# Patient Record
Sex: Female | Born: 1940 | Race: White | Hispanic: No | Smoking: Former smoker
Health system: Southern US, Community
[De-identification: ages and names within clinical notes are randomized; demographics above are authoritative.]

## PROBLEM LIST (undated history)

## (undated) DIAGNOSIS — J439 Emphysema, unspecified: Secondary | ICD-10-CM

## (undated) DIAGNOSIS — I519 Heart disease, unspecified: Secondary | ICD-10-CM

## (undated) DIAGNOSIS — M1990 Unspecified osteoarthritis, unspecified site: Secondary | ICD-10-CM

## (undated) DIAGNOSIS — I10 Essential (primary) hypertension: Secondary | ICD-10-CM

## (undated) DIAGNOSIS — E079 Disorder of thyroid, unspecified: Secondary | ICD-10-CM

## (undated) DIAGNOSIS — J449 Chronic obstructive pulmonary disease, unspecified: Secondary | ICD-10-CM

## (undated) HISTORY — PX: BREAST BIOPSY: SHX20

## (undated) HISTORY — PX: ABDOMINAL AORTIC ANEURYSM REPAIR: SUR1152

## (undated) HISTORY — PX: LEG SURGERY: SHX1003

## (undated) HISTORY — DX: Heart disease, unspecified: I51.9

## (undated) HISTORY — DX: Unspecified osteoarthritis, unspecified site: M19.90

## (undated) HISTORY — PX: CHOLECYSTECTOMY: SHX55

## (undated) HISTORY — DX: Disorder of thyroid, unspecified: E07.9

## (undated) HISTORY — DX: Emphysema, unspecified: J43.9

---

## 2019-09-16 HISTORY — PX: PACEMAKER IMPLANT: EP1218

## 2020-04-09 DIAGNOSIS — Z9889 Other specified postprocedural states: Secondary | ICD-10-CM | POA: Insufficient documentation

## 2020-04-09 DIAGNOSIS — Z950 Presence of cardiac pacemaker: Secondary | ICD-10-CM | POA: Insufficient documentation

## 2020-04-09 DIAGNOSIS — I739 Peripheral vascular disease, unspecified: Secondary | ICD-10-CM | POA: Insufficient documentation

## 2020-04-09 DIAGNOSIS — J449 Chronic obstructive pulmonary disease, unspecified: Secondary | ICD-10-CM | POA: Insufficient documentation

## 2020-04-18 ENCOUNTER — Other Ambulatory Visit: Payer: Self-pay

## 2020-04-18 ENCOUNTER — Emergency Department
Admission: EM | Admit: 2020-04-18 | Discharge: 2020-04-18 | Disposition: A | Discharge status: Home / Self Care | Payer: Medicare Other | Attending: Emergency Medicine | Admitting: Emergency Medicine

## 2020-04-18 DIAGNOSIS — J449 Chronic obstructive pulmonary disease, unspecified: Secondary | ICD-10-CM | POA: Insufficient documentation

## 2020-04-18 DIAGNOSIS — R040 Epistaxis: Secondary | ICD-10-CM | POA: Insufficient documentation

## 2020-04-18 HISTORY — DX: Chronic obstructive pulmonary disease, unspecified: J44.9

## 2020-04-18 LAB — CBC
HCT: 32.9 % — ABNORMAL LOW (ref 36.0–46.0)
HCT: 31.8 % — ABNORMAL LOW (ref 36.0–46.0)
Hemoglobin: 10.6 g/dL — ABNORMAL LOW (ref 12.0–15.0)
Hemoglobin: 10.2 g/dL — ABNORMAL LOW (ref 12.0–15.0)
MCH: 31.4 pg (ref 26.0–34.0)
MCH: 31.2 pg (ref 26.0–34.0)
MCHC: 32.2 g/dL (ref 30.0–36.0)
MCHC: 32.1 g/dL (ref 30.0–36.0)
MCV: 97.3 fL (ref 80.0–100.0)
MCV: 97.2 fL (ref 80.0–100.0)
Platelets: 227 10*3/uL (ref 150–400)
Platelets: 210 10*3/uL (ref 150–400)
RBC: 3.38 MIL/uL — ABNORMAL LOW (ref 3.87–5.11)
RBC: 3.27 MIL/uL — ABNORMAL LOW (ref 3.87–5.11)
RDW: 13.2 % (ref 11.5–15.5)
RDW: 13 % (ref 11.5–15.5)
WBC: 6 10*3/uL (ref 4.0–10.5)
WBC: 6.5 10*3/uL (ref 4.0–10.5)
nRBC: 0 % (ref 0.0–0.2)
nRBC: 0 % (ref 0.0–0.2)

## 2020-04-18 LAB — PROTIME-INR
INR: 1.3 — ABNORMAL HIGH (ref 0.8–1.2)
Prothrombin Time: 15.9 seconds — ABNORMAL HIGH (ref 11.4–15.2)

## 2020-04-18 MED ORDER — SILVER NITRATE-POT NITRATE 75-25 % EX MISC
1.0000 | CUTANEOUS | Status: AC
  Administered 2020-04-18: 1 via TOPICAL
  Filled 2020-04-18: qty 10

## 2020-04-18 NOTE — ED Triage Notes (Addendum)
Arrives [facility] for ED evaluation of epistaxis x 2 hours.  2 sprays afrin given by EMS.  Patient takes Eloquis.   Arrives from home.

## 2020-04-18 NOTE — ED Notes (Signed)
Rounded on patient.  Daughter concerned that patient continues to have nose bleed.  Patient assessed.  Small amount of epistaxis noted.  Nose clamp in place.  Patient is AAOx3.  Skin  Pale, warm and dry. NAD

## 2020-04-18 NOTE — ED Notes (Signed)
Pt placed on 2L [place]. [place] placed on face to bottom lips due to nose currenly being clamped due to bleed

## 2020-04-18 NOTE — ED Provider Notes (Signed)
[facility] Emergency Department Provider Note   ____________________________________________   First MD Initiated Contact with Patient 04/18/20 1135     (approximate)  I have reviewed the triage vital signs and the nursing notes.   HISTORY  Chief Complaint Epistaxis    HPI Danielle Sandoval is a 79 y.o. female here for evaluation of nosebleed  This morning at 4:18 AM patient began developing bleeding from her right nose.  They attempted to hold pressure over it briefly but this did not assist.  She does take blood thinner for atrial fibrillation.  She is continued to have intermittent bleeding from the right nose, had a slight amount last night.  No injury but she does report that she was picking at it when it started last night.  Otherwise feeling well.  No history of falls or injuries.  No fevers or chills.  No sinus pain.    Bleeding seems to have stopped after having her nose clamped in the ED  Past Medical History:  Diagnosis Date  . COPD (chronic obstructive pulmonary disease) (HCC)     There are no problems to display for this patient.     Prior to Admission medications   Not on File    Allergies Patient has no allergy information on record.  No family history on file.  Social History Social History   Tobacco Use  . Smoking status: Not on file  Substance Use Topics  . Alcohol use: Not on file  . Drug use: Not on file    Review of Systems Constitutional: No fever/chills or recent illness Eyes: No visual changes. ENT: See HPI Cardiovascular: Denies chest pain. Respiratory: Denies shortness of breath.  Uses 2 L of oxygen at baseline Gastrointestinal: No abdominal pain.   Skin: Negative for rash. Neurological: Negative for headaches or weakness.    ____________________________________________   PHYSICAL EXAM:  VITAL SIGNS: ED Triage Vitals  Enc Vitals Group     BP 04/18/20 0719 (!) 80/70     Pulse Rate 04/18/20 0719  76     Resp 04/18/20 0719 20     Temp 04/18/20 0719 97.8 F (36.6 C)     Temp Source 04/18/20 0719 Axillary     SpO2 04/18/20 0719 91 %     Weight 04/18/20 0718 98 lb (44.5 kg)     Height 04/18/20 0718 5\' 1"  (1.549 m)     Head Circumference --      Peak Flow --      Pain Score 04/18/20 0721 0     Pain Loc --      Pain Edu? --      Excl. in GC? --     Constitutional: Alert and oriented. Well appearing and in no acute distress.  Daughter also at bedside.  Patient very pleasant in no distress. Eyes: Conjunctivae are normal. Head: Atraumatic. Nose: No congestion/rhinnorhea.  She does have evidence of small amount of clot adherent to the region of Hesselbach's plexus in the right nare.  No evidence of bleeding over the left side.  Turbinates appear normal except for a small amount of adherent clot over the right nare anterior along the septum.  No septal hematoma. Mouth/Throat: Mucous membranes are moist.  No active bleeding in the posterior oropharynx. Neck: No stridor.  Cardiovascular: Normal rate, regular rhythm. Grossly normal heart sounds.  Good peripheral circulation. Respiratory: Normal respiratory effort.  No retractions. Lungs CTAB. Gastrointestinal: Soft and nontender. No distention. Musculoskeletal: No lower extremity tenderness  nor edema. Neurologic:  Normal speech and language. No gross focal neurologic deficits are appreciated.  Skin:  Skin is warm, dry and intact. No rash noted. Psychiatric: Mood and affect are normal. Speech and behavior are normal.  ____________________________________________   LABS (all labs ordered are listed, but only abnormal results are displayed)  Labs Reviewed  CBC - Abnormal; Notable for the following components:      Result Value   RBC 3.38 (*)    Hemoglobin 10.6 (*)    HCT 32.9 (*)    All other components within normal limits  PROTIME-INR - Abnormal; Notable for the following components:   Prothrombin Time 15.9 (*)    INR 1.3 (*)     All other components within normal limits  CBC - Abnormal; Notable for the following components:   RBC 3.27 (*)    Hemoglobin 10.2 (*)    HCT 31.8 (*)    All other components within normal limits   ____________________________________________  EKG   ____________________________________________  RADIOLOGY   ____________________________________________   PROCEDURES  Procedure(s) performed: Epistaxis management  .Epistaxis Management  Date/Time: 04/18/2020 3:23 PM Performed by: Sharyn Creamer, MD Authorized by: Sharyn Creamer, MD   Consent:    Consent obtained:  Verbal   Consent given by:  Patient   Risks discussed:  Bleeding, infection and pain   Alternatives discussed:  Referral Anesthesia (see MAR for exact dosages):    Anesthesia method:  None Procedure details:    Treatment site:  R septum   Treatment method:  Silver nitrate   Treatment complexity:  Limited   Treatment episode: initial   Post-procedure details:    Assessment:  Bleeding stopped   Patient tolerance of procedure:  Tolerated well, no immediate complications    Critical Care performed: No  ____________________________________________   INITIAL IMPRESSION / ASSESSMENT AND PLAN / ED COURSE  Pertinent labs & imaging results that were available during my care of the patient were reviewed by me and considered in my medical decision making (see chart for details).   After initial clamping, the patient did have some very slight amount of recurrent epistaxis from the right anterior nare, discussed risk benefits and we are able to use silver nitrate stick to cauterize and stop bleeding.  Patient tolerated procedure very well.  No complications.  All bleeding ceased thereafter.  Patient is on anticoagulation, she will follow up closely and has made appointment actually with ENT now on Monday.  Careful return precautions and treatment recommendations advised.    Return precautions and treatment recommendations  and follow-up discussed with the patient who is agreeable with the plan.       ____________________________________________   FINAL CLINICAL IMPRESSION(S) / ED DIAGNOSES  Final diagnoses:  Anterior epistaxis        Note:  This document was prepared using Dragon voice recognition software and may include unintentional dictation errors       Sharyn Creamer, MD 04/18/20 1525

## 2020-05-07 ENCOUNTER — Encounter (INDEPENDENT_AMBULATORY_CARE_PROVIDER_SITE_OTHER): Payer: Self-pay | Admitting: Vascular Surgery

## 2020-05-07 ENCOUNTER — Other Ambulatory Visit: Payer: Self-pay

## 2020-05-07 ENCOUNTER — Ambulatory Visit (INDEPENDENT_AMBULATORY_CARE_PROVIDER_SITE_OTHER): Payer: Medicare Other | Admitting: Vascular Surgery

## 2020-05-07 VITALS — BP 110/67 | HR 77 | Resp 14 | Ht 61.0 in | Wt 100.0 lb

## 2020-05-07 DIAGNOSIS — I714 Abdominal aortic aneurysm, without rupture, unspecified: Secondary | ICD-10-CM

## 2020-05-07 DIAGNOSIS — J449 Chronic obstructive pulmonary disease, unspecified: Secondary | ICD-10-CM | POA: Diagnosis not present

## 2020-05-07 DIAGNOSIS — I739 Peripheral vascular disease, unspecified: Secondary | ICD-10-CM

## 2020-05-07 DIAGNOSIS — R0989 Other specified symptoms and signs involving the circulatory and respiratory systems: Secondary | ICD-10-CM | POA: Diagnosis not present

## 2020-05-08 ENCOUNTER — Encounter (INDEPENDENT_AMBULATORY_CARE_PROVIDER_SITE_OTHER): Payer: Self-pay | Admitting: Vascular Surgery

## 2020-05-08 DIAGNOSIS — I714 Abdominal aortic aneurysm, without rupture, unspecified: Secondary | ICD-10-CM | POA: Insufficient documentation

## 2020-05-08 DIAGNOSIS — I6529 Occlusion and stenosis of unspecified carotid artery: Secondary | ICD-10-CM | POA: Insufficient documentation

## 2020-05-08 NOTE — Progress Notes (Signed)
MRN : [id number]  Danielle Sandoval is a 79 y.o. (Apr 15, 1941) female who presents with chief complaint of  Chief Complaint  Patient presents with  . New Patient (Initial Visit)    ref [place] PVD  .  History of Present Illness:   Patient is seen for evaluation of abdominal aortic aneurysm.  She also states she has an aneurysm in her leg but she is uncertain as to the location or which side.  Patient denies abdominal pain or unusual back pain, no other abdominal complaints.  No history of an acute onset of painful blue discoloration of the toes.     No family history of AAA.   Patient denies amaurosis fugax or TIA symptoms. There is no history of claudication or rest pain symptoms of the lower extremities.  The patient denies angina or shortness of breath.    Current Meds  Medication Sig  . acetaminophen (TYLENOL) 650 MG suppository SMARTSIG:1 SUPPOS Rectally Every 4 Hours PRN  . apixaban (ELIQUIS) 5 MG TABS tablet   . BREO ELLIPTA 200-25 MCG/INH AEPB SMARTSIG:1 Inhalation Via Inhaler Daily  . clopidogrel (PLAVIX) 75 MG tablet Take 75 mg by mouth daily.  . COMBIVENT RESPIMAT 20-100 MCG/ACT AERS respimat SMARTSIG:2 Inhalation Via Inhaler 4 Times Daily  . escitalopram (LEXAPRO) 5 MG tablet Take 5 mg by mouth daily.  . ferrous sulfate 325 (65 FE) MG EC tablet Take 1 tablet by mouth daily.  . fluticasone (FLONASE) 50 MCG/ACT nasal spray Place into the nose.  . furosemide (LASIX) 20 MG tablet Take 20 mg by mouth daily.  Marland Kitchen gabapentin (NEURONTIN) 100 MG capsule Take 100 mg by mouth 2 (two) times daily.  Marland Kitchen levothyroxine (SYNTHROID) 25 MCG tablet Take 25 mcg by mouth daily.  Marland Kitchen LORazepam (ATIVAN) 0.5 MG tablet Take 0.5 mg by mouth every 4 (four) hours as needed.  . metoprolol succinate (TOPROL-XL) 25 MG 24 hr tablet   . pantoprazole (PROTONIX) 40 MG tablet Take 40 mg by mouth daily.    Past Medical History:  Diagnosis Date  . Arthritis   . COPD (chronic obstructive pulmonary disease) (HCC)     . Emphysema of lung (HCC)   . Heart disease   . Thyroid disease      Social History Social History   Tobacco Use  . Smoking status: Former Games developer  . Smokeless tobacco: Never Used  Substance Use Topics  . Alcohol use: Never  . Drug use: Never    Family History Family History  Problem Relation Age of Onset  . Dementia Mother   . Prostate cancer Father   . Varicose Veins Daughter   . Diabetes Daughter   No family history of bleeding/clotting disorders, porphyria or autoimmune disease   Allergies  Allergen Reactions  . Atorvastatin Itching  . Azithromycin Swelling    Swelling of the throat   . Clotrimazole Hives  . Fluvastatin Other (See Comments)  . Lisinopril     Other reaction(s): Unknown  . Lovastatin Other (See Comments)    Leg aches   . Potassium Bromide     Other reaction(s): Unknown  . Risedronate Sodium Itching  . Tiotropium     Other reaction(s): Dizziness     REVIEW OF SYSTEMS (Negative unless checked)  Constitutional: [] Weight loss  [] Fever  [] Chills Cardiac: [] Chest pain   [] Chest pressure   [] Palpitations   [] Shortness of breath when laying flat   [] Shortness of breath with exertion. Vascular:  [] Pain in legs with walking   []   Pain in legs at rest  [] History of DVT   [] Phlebitis   [] Swelling in legs   [] Varicose veins   [] Non-healing ulcers Pulmonary:   [] Uses home oxygen   [] Productive cough   [] Hemoptysis   [] Wheeze  [] COPD   [] Asthma Neurologic:  [] Dizziness   [] Seizures   [] History of stroke   [] History of TIA  [] Aphasia   [] Vissual changes   [] Weakness or numbness in arm   [] Weakness or numbness in leg Musculoskeletal:   [] Joint swelling   [x] Joint pain   [] Low back pain Hematologic:  [] Easy bruising  [] Easy bleeding   [] Hypercoagulable state   [] Anemic Gastrointestinal:  [] Diarrhea   [] Vomiting  [] Gastroesophageal reflux/heartburn   [] Difficulty swallowing. Genitourinary:  [] Chronic kidney disease   [] Difficult urination  [] Frequent urination    [] Blood in urine Skin:  [] Rashes   [] Ulcers  Psychological:  [] History of anxiety   []  History of major depression.  Physical Examination  Vitals:   05/07/20 1313  BP: 110/67  Pulse: 77  Resp: 14  Weight: 100 lb (45.4 kg)  Height: 5\' 1"  (1.549 m)   Body mass index is 18.89 kg/m. Gen: WD/WN, NAD Head: [place]/AT, No temporalis wasting.  Ear/Nose/Throat: Hearing grossly intact, nares w/o erythema or drainage, poor dentition Eyes: PER, EOMI, sclera nonicteric.  Neck: Supple, no masses.  No bruit or JVD.  Pulmonary:  Good air movement, clear to auscultation bilaterally, no use of accessory muscles.  Cardiac: RRR, normal S1, S2. Vascular: Popliteal pulses are mildly broadened; bilateral carotid bruits Vessel Right Left  Radial Palpable Palpable  Popliteal Palpable Palpable  PT Not Palpable Not Palpable  DP Not Palpable Not Palpable  Gastrointestinal: soft, non-distended. No guarding/no peritoneal signs.  Musculoskeletal: M/S 5/5 throughout.  No deformity or atrophy.  Neurologic: CN 2-12 intact. Pain and light touch intact in extremities.  Symmetrical.  Speech is fluent. Motor exam as listed above. Psychiatric: Judgment intact, Mood & affect appropriate for pt's clinical situation. Dermatologic: No rashes or ulcers noted.  No changes consistent with cellulitis.  CBC Lab Results  Component Value Date   WBC 6.5 04/18/2020   HGB 10.2 (L) 04/18/2020   HCT 31.8 (L) 04/18/2020   MCV 97.2 04/18/2020   PLT 210 04/18/2020    BMET No results found for: NA, K, CL, CO2, GLUCOSE, BUN, CREATININE, CALCIUM, GFRNONAA, GFRAA CrCl cannot be calculated (No successful lab value found.).  COAG Lab Results  Component Value Date   INR 1.3 (H) 04/18/2020    Radiology No results found.    Assessment/Plan 1. AAA (abdominal aortic aneurysm) without rupture (HCC) No surgery or intervention at this time. The patient has an asymptomatic abdominal aortic aneurysm by history and is due for her  ultrasound.  I have discussed the natural history of abdominal aortic aneurysm and the small risk of rupture for aneurysm less than 5 cm in size.  However, as these small aneurysms tend to enlarge over time, continued surveillance with ultrasound or CT scan is mandatory.  I have also discussed optimizing medical management with hypertension and lipid control and the importance of abstinence from tobacco.  The patient is also encouraged to exercise a minimum of 30 minutes 4 times a week.  Should the patient develop new onset abdominal or back pain or signs of peripheral embolization they are instructed to seek medical attention immediately and to alert the physician providing care that they have an aneurysm.  The patient voices their understanding. I have scheduled the patient to return  in 1 month with an aortic duplex. - VAS US AORTA/IVC/ILIACS; Future  2. Peripheral vascular disease (HCC) Recommend:  Patient should undergo arterial duplex of the lower extremity.    The risks and benefits as well as the alternatives were discussed in detail with the patient.  All questions were answered.  Patient agrees to proceed and understands this could be a prelude to angiography and intervention.  The patient will follow up with me in the office to review the studies.  - VAS [place] LOWER EXTREMITY ARTERIAL DUPLEX; Future  3. Chronic obstructive pulmonary disease, unspecified COPD type (HCC) Continue pulmonary medications and aerosols as already ordered, these medications have been reviewed and there are no changes at this time.    4. Bilateral carotid bruits Recommend:  Given the patient's asymptomatic carotid bruits no further invasive testing or surgery at this time.  Duplex ultrasound will be ordered.  Continue antiplatelet therapy as prescribed Continue management of CAD, HTN and Hyperlipidemia Healthy heart diet,  encouraged exercise at least 4 times per week Follow up in 1 month with duplex  ultrasound and physical exam  - VAS US CAROTID; Future   Levora Dredge, MD  05/08/2020 12:28 PM

## 2020-05-31 ENCOUNTER — Ambulatory Visit (INDEPENDENT_AMBULATORY_CARE_PROVIDER_SITE_OTHER): Payer: Medicare Other

## 2020-05-31 ENCOUNTER — Encounter (INDEPENDENT_AMBULATORY_CARE_PROVIDER_SITE_OTHER): Payer: Self-pay | Admitting: Vascular Surgery

## 2020-05-31 ENCOUNTER — Other Ambulatory Visit: Payer: Self-pay

## 2020-05-31 ENCOUNTER — Ambulatory Visit (INDEPENDENT_AMBULATORY_CARE_PROVIDER_SITE_OTHER): Payer: Medicare Other | Admitting: Vascular Surgery

## 2020-05-31 VITALS — BP 95/59 | HR 94 | Resp 21 | Ht 61.0 in | Wt 100.0 lb

## 2020-05-31 DIAGNOSIS — I739 Peripheral vascular disease, unspecified: Secondary | ICD-10-CM

## 2020-05-31 DIAGNOSIS — Z9889 Other specified postprocedural states: Secondary | ICD-10-CM

## 2020-05-31 DIAGNOSIS — I714 Abdominal aortic aneurysm, without rupture, unspecified: Secondary | ICD-10-CM

## 2020-05-31 DIAGNOSIS — R0989 Other specified symptoms and signs involving the circulatory and respiratory systems: Secondary | ICD-10-CM

## 2020-05-31 DIAGNOSIS — J449 Chronic obstructive pulmonary disease, unspecified: Secondary | ICD-10-CM | POA: Diagnosis not present

## 2020-05-31 DIAGNOSIS — I6523 Occlusion and stenosis of bilateral carotid arteries: Secondary | ICD-10-CM | POA: Diagnosis not present

## 2020-06-01 ENCOUNTER — Encounter (INDEPENDENT_AMBULATORY_CARE_PROVIDER_SITE_OTHER): Payer: Self-pay | Admitting: Vascular Surgery

## 2020-06-01 NOTE — Progress Notes (Signed)
MRN : [id number]  Danielle Sandoval is a 79 y.o. (Jun 23, 1941) female who presents with chief complaint of  Chief Complaint  Patient presents with  . Follow-up  .  History of Present Illness:   The patient returns to the office for surveillance of an abdominal aortic aneurysm status post stent graft placement on 08/2015.   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 The patient also has peripheral vascular disease status post abdominal aortic stent 08/2015 and right leg stents 06/2016  Patient denies abdominal pain or back pain, no other abdominal complaints. No groin related complaints. No symptoms consistent with distal embolization No changes in claudication distance.   There have been no interval changes in his overall healthcare since his last visit.   Patient denies amaurosis fugax or TIA symptoms. There is no history of claudication or rest pain symptoms of the lower extremities. The patient denies angina or shortness of breath.   Duplex US of the aorta and iliac arteries shows a 4.05 cm AAA sac with no endoleak.  Duplex ultrasound of the lower extremities bilaterally shows patent arteries with triphasic signals no hemodynamicly significant stenosis is identified.    Duplex ultrasound the carotid arteries is consistent with less than 40% stenosis bilateral internal carotid arteries  Current Meds  Medication Sig  . apixaban (ELIQUIS) 5 MG TABS tablet   . Ascorbic Acid (VITAMIN C) 1000 MG tablet SMARTSIG:By Mouth  . BREO ELLIPTA 200-25 MCG/INH AEPB SMARTSIG:1 Inhalation Via Inhaler Daily  . clopidogrel (PLAVIX) 75 MG tablet Take 75 mg by mouth daily.  . COMBIVENT RESPIMAT 20-100  MCG/ACT AERS respimat SMARTSIG:2 Inhalation Via Inhaler 4 Times Daily  . DULCOLAX 5 MG EC tablet Take 10 mg by mouth daily.  Marland Kitchen escitalopram (LEXAPRO) 5 MG tablet Take 5 mg by mouth daily.  . ferrous sulfate 325 (65 FE) MG EC tablet Take 1 tablet by mouth daily.  . fluticasone (FLONASE) 50 MCG/ACT nasal spray Place into the nose.  . furosemide (LASIX) 20 MG tablet Take 20 mg by mouth daily.  Marland Kitchen gabapentin (NEURONTIN) 100 MG capsule Take 100 mg by mouth 2 (two) times daily.  Marland Kitchen levothyroxine (SYNTHROID) 25 MCG tablet Take 25 mcg by mouth daily.  Marland Kitchen MELATONIN MAXIMUM STRENGTH 5 MG TABS SMARTSIG:1 Tablet(s) By Mouth PRN PRN  . metoprolol succinate (TOPROL-XL) 25 MG 24 hr tablet   . MUCINEX MAXIMUM STRENGTH 1200 MG TB12 Take 1 tablet by mouth daily.  . pantoprazole (PROTONIX) 40 MG tablet Take 40 mg by mouth daily.  Marland Kitchen VITAMIN D-1000 MAX ST 25 MCG (1000 UT) tablet Take 1,000 Units by mouth daily.    Past Medical History:  Diagnosis Date  . Arthritis   . COPD (chronic obstructive pulmonary disease) (HCC)   . Emphysema of lung (HCC)   . Heart disease   . Thyroid disease     Past Surgical History:  Procedure Laterality Date  . ABDOMINAL AORTIC ANEURYSM REPAIR    . CHOLECYSTECTOMY    . LEG SURGERY     stent placement  . PACEMAKER IMPLANT  09/16/2019    Social History Social History   Tobacco Use  . Smoking status: Former Games developer  . Smokeless tobacco: Never Used  Substance Use Topics  .  Alcohol use: Never  . Drug use: Never    Family History Family History  Problem Relation Age of Onset  . Dementia Mother   . Prostate cancer Father   . Varicose Veins Daughter   . Diabetes Daughter     Allergies  Allergen Reactions  . Atorvastatin Itching  . Azithromycin Swelling    Swelling of the throat   . Clotrimazole Hives  . Fluvastatin Other (See Comments)  . Lisinopril     Other reaction(s): Unknown  . Lovastatin Other (See Comments)    Leg aches   . Potassium Bromide      Other reaction(s): Unknown  . Risedronate Sodium Itching  . Tiotropium     Other reaction(s): Dizziness     REVIEW OF SYSTEMS (Negative unless checked)  Constitutional: Weight loss  Fever  Chills Cardiac: Chest pain   Chest pressure   Palpitations   Shortness of breath when laying flat   Shortness of breath with exertion. Vascular:  Pain in legs with walking   Pain in legs at rest  History of DVT   Phlebitis   Swelling in legs   Varicose veins   Non-healing ulcers Pulmonary:   Uses home oxygen   Productive cough   Hemoptysis   Wheeze  COPD   Asthma Neurologic:  Dizziness   Seizures   History of stroke   History of TIA  Aphasia   Vissual changes   Weakness or numbness in arm   Weakness or numbness in leg Musculoskeletal:   Joint swelling   Joint pain   Low back pain Hematologic:  Easy bruising  Easy bleeding   Hypercoagulable state   Anemic Gastrointestinal:  Diarrhea   Vomiting  Gastroesophageal reflux/heartburn   Difficulty swallowing. Genitourinary:  Chronic kidney disease   Difficult urination  Frequent urination   Blood in urine Skin:  Rashes   Ulcers  Psychological:  History of anxiety    History of major depression.  Physical Examination  Vitals:   05/31/20 1118  BP: (!) 95/59  Pulse: 94  Resp: (!) 21  Weight: 100 lb (45.4 kg)  Height:  (1.549 m)   Body mass index is 18.89 kg/m. Gen: WD/WN, NAD Head: [place]/AT, No temporalis wasting.  Ear/Nose/Throat: Hearing grossly intact, nares w/o erythema or drainage Eyes: PER, EOMI, sclera nonicteric.  Neck: Supple, no large masses.   Pulmonary:  Good air movement, no audible wheezing bilaterally, no use of accessory muscles.  Cardiac: RRR, no JVD Vascular: bilateral carotid bruit Vessel Right Left  Radial Palpable Palpable  Carotid Palpable Palpable  PT Palpable Palpable  DP Palpable Palpable  Gastrointestinal: Non-distended.  No guarding/no peritoneal signs.  Musculoskeletal: M/S 5/5 throughout.  No deformity or atrophy.  Neurologic: CN 2-12 intact. Symmetrical.  Speech is fluent. Motor exam as listed above. Psychiatric: Judgment intact, Mood & affect appropriate for pt's clinical situation. Dermatologic: No rashes or ulcers noted.  No changes consistent with cellulitis.   CBC Lab Results  Component Value Date   WBC 6.5 04/18/2020   HGB 10.2 (L) 04/18/2020   HCT 31.8 (L) 04/18/2020   MCV 97.2 04/18/2020   PLT 210 04/18/2020    BMET No results found for: NA, K, CL, CO2, GLUCOSE, BUN, CREATININE, CALCIUM, GFRNONAA, GFRAA CrCl cannot be calculated (No successful lab value found.).  COAG Lab Results  Component Value Date   INR 1.3 (H) 04/18/2020    Radiology VAS US CAROTID  Result Date: 05/31/2020 Carotid Arterial Duplex Study Indications: Bilateral bruits.  Performing Technologist: Debbe Bales RVS  Examination Guidelines: A complete evaluation includes B-mode imaging, spectral Doppler, color Doppler, and power Doppler as needed of all accessible portions of each vessel. Bilateral testing is considered an integral part of a complete examination. Limited examinations for reoccurring indications may be performed as noted.  Right Carotid Findings: +----------+--------+--------+--------+------------------+--------+           PSV cm/sEDV cm/sStenosisPlaque DescriptionComments +----------+--------+--------+--------+------------------+--------+ CCA Prox  52      9                                          +----------+--------+--------+--------+------------------+--------+ CCA Mid   57      16                                         +----------+--------+--------+--------+------------------+--------+ CCA Distal57      19                                         +----------+--------+--------+--------+------------------+--------+ ICA Prox  43      13                                          +----------+--------+--------+--------+------------------+--------+ ICA Mid   73      25                                         +----------+--------+--------+--------+------------------+--------+ ICA Distal59      18                                         +----------+--------+--------+--------+------------------+--------+ ECA       75      12                                         +----------+--------+--------+--------+------------------+--------+ +----------+--------+-------+--------+-------------------+           PSV cm/sEDV cmsDescribeArm Pressure (mmHG) +----------+--------+-------+--------+-------------------+ Subclavian52      0                                  +----------+--------+-------+--------+-------------------+ +---------+--------+--+--------+--+ VertebralPSV cm/s37EDV cm/s11 +---------+--------+--+--------+--+  Left Carotid Findings: +----------+--------+--------+--------+------------------+--------+           PSV cm/sEDV cm/sStenosisPlaque DescriptionComments +----------+--------+--------+--------+------------------+--------+ CCA Prox  98      17                                         +----------+--------+--------+--------+------------------+--------+ CCA Mid   68      16                                         +----------+--------+--------+--------+------------------+--------+  CCA Distal62      16                                         +----------+--------+--------+--------+------------------+--------+ ICA Prox  41      11                                         +----------+--------+--------+--------+------------------+--------+ ICA Mid   39      16                                         +----------+--------+--------+--------+------------------+--------+ ICA Distal74      28                                         +----------+--------+--------+--------+------------------+--------+ ECA       43      8                                           +----------+--------+--------+--------+------------------+--------+ +----------+--------+--------+--------+-------------------+           PSV cm/sEDV cm/sDescribeArm Pressure (mmHG) +----------+--------+--------+--------+-------------------+ [id number]      0                                   +----------+--------+--------+--------+-------------------+ +---------+--------+--+--------+--+ VertebralPSV cm/s43EDV cm/s16 +---------+--------+--+--------+--+   Summary: Right Carotid: Velocities in the right ICA are consistent with a 1-39% stenosis. Left Carotid: Velocities in the left ICA are consistent with a 1-39% stenosis. Vertebrals:  Bilateral vertebral arteries demonstrate antegrade flow. Subclavians: Normal flow hemodynamics were seen in bilateral subclavian              arteries. *See table(s) above for measurements and observations.  Electronically signed by Levora Dredge MD on 05/31/2020 at 12:09:39 PM.    Final    VAS [place] LOWER EXTREMITY ARTERIAL DUPLEX  Result Date: 05/31/2020 LOWER EXTREMITY ARTERIAL DUPLEX STUDY Indications: Peripheral artery disease.  Current ABI: Not Obtained Performing Technologist: Debbe Bales RVS  Examination Guidelines: A complete evaluation includes B-mode imaging, spectral Doppler, color Doppler, and power Doppler as needed of all accessible portions of each vessel. Bilateral testing is considered an integral part of a complete examination. Limited examinations for reoccurring indications may be performed as noted.  +-----------+--------+-----+--------+---------+--------+ RIGHT      PSV cm/sRatioStenosisWaveform Comments +-----------+--------+-----+--------+---------+--------+ CFA Distal 46                   triphasic         +-----------+--------+-----+--------+---------+--------+ DFA        34                   triphasic         +-----------+--------+-----+--------+---------+--------+ SFA  Prox   40                   triphasic         +-----------+--------+-----+--------+---------+--------+ SFA Mid  67                   triphasic         +-----------+--------+-----+--------+---------+--------+ SFA Distal 57                   triphasic         +-----------+--------+-----+--------+---------+--------+ POP Distal 40                   triphasic         +-----------+--------+-----+--------+---------+--------+ ATA Distal 28                   biphasic          +-----------+--------+-----+--------+---------+--------+ PTA Distal 41                   biphasic          +-----------+--------+-----+--------+---------+--------+ PERO Distal19                   biphasic          +-----------+--------+-----+--------+---------+--------+  +-----------+--------+-----+--------+---------+--------+ LEFT       PSV cm/sRatioStenosisWaveform Comments +-----------+--------+-----+--------+---------+--------+ CFA Distal 105                  triphasic         +-----------+--------+-----+--------+---------+--------+ DFA        42                   triphasic         +-----------+--------+-----+--------+---------+--------+ SFA Prox   53                   triphasic         +-----------+--------+-----+--------+---------+--------+ SFA Mid    72                   triphasic         +-----------+--------+-----+--------+---------+--------+ SFA Distal 47                   triphasic         +-----------+--------+-----+--------+---------+--------+ POP Distal 29                   triphasic         +-----------+--------+-----+--------+---------+--------+ ATA Distal 25                   biphasic          +-----------+--------+-----+--------+---------+--------+ PTA Distal 32                   triphasic         +-----------+--------+-----+--------+---------+--------+ PERO Distal20                   biphasic           +-----------+--------+-----+--------+---------+--------+  Summary: Right: Imaging and Waveforms obtained throughout in the Right Lower Extremity.  Left: Imaging and Waveforms obtained throughout in the Left Lower Extremity.  See table(s) above for measurements and observations. Electronically signed by Levora Dredge MD on 05/31/2020 at 12:09:31 PM.    Final    VAS US AORTA/IVC/ILIACS  Result Date: 05/31/2020 ABDOMINAL AORTA STUDY Indications: Follow up exam for EVAR.  Performing Technologist: Debbe Bales RVS  Examination Guidelines: A complete evaluation includes B-mode imaging, spectral Doppler, color Doppler, and power Doppler as needed of all accessible portions of each vessel. Bilateral testing is considered an integral part of a complete examination. Limited  examinations for reoccurring indications may be performed as noted.  Abdominal Aorta Findings: +-------------+-------+----------+----------+----------+--------+--------+ Location     AP (cm)Trans (cm)PSV (cm/s)Waveform  ThrombusComments +-------------+-------+----------+----------+----------+--------+--------+ Proximal     3.57   3.57      19        monophasic                 +-------------+-------+----------+----------+----------+--------+--------+ Mid          3.71   4.05      18        monophasic                 +-------------+-------+----------+----------+----------+--------+--------+ RT CIA Distal0.9    0.9       43        monophasic                 +-------------+-------+----------+----------+----------+--------+--------+ RT EIA Prox  0.6    0.6       42        triphasic                  +-------------+-------+----------+----------+----------+--------+--------+ RT EIA Distal0.6    0.6       41        biphasic                   +-------------+-------+----------+----------+----------+--------+--------+ LT CIA Distal1.0    0.9       42        monophasic                  +-------------+-------+----------+----------+----------+--------+--------+ LT EIA Prox  1.0    1.0       57        monophasic                 +-------------+-------+----------+----------+----------+--------+--------+ LT EIA Distal0.7    0.5       64        monophasic                 +-------------+-------+----------+----------+----------+--------+--------+ Endovascular Aortic Repair (EVAR): +----------+----------------+-------------------+-------------------+           Diameter AP (cm)Diameter Trans (cm)Velocities (cm/sec) +----------+----------------+-------------------+-------------------+ Aorta     3.71            4.05               27                  +----------+----------------+-------------------+-------------------+ Right Limb1.39            1.31               26                  +----------+----------------+-------------------+-------------------+ Left Limb 1.48            1.57               19                  +----------+----------------+-------------------+-------------------+  Summary: Abdominal Aorta: Patent endovascular aneurysm repair with no evidence of endoleak. Initial Imaging of the Patient, No extreme Velocities seen throughout.  *See table(s) above for measurements and observations.  Electronically signed by Levora Dredge MD on 05/31/2020 at 12:09:34 PM.    Final      Assessment/Plan 1. AAA (abdominal aortic aneurysm) without rupture (HCC) Recommend: Patient is status post successful endovascular repair of the AAA.   No further intervention is required  at this time.   No endoleak is detected and the aneurysm sac is stable.  The patient will continue antiplatelet therapy as prescribed as well as aggressive management of hyperlipidemia. Exercise is again strongly encouraged.   However, endografts require continued surveillance with ultrasound or CT scan. This is mandatory to detect any changes that allow repressurization of the aneurysm sac.  The  patient is informed that this would be asymptomatic.  The patient is reminded that lifelong routine surveillance is a necessity with an endograft. Patient will continue to follow-up at 12 month intervals with ultrasound of the aorta.  2. Peripheral vascular disease (HCC)  Recommend:  The patient has evidence of atherosclerosis of the lower extremities with claudication.  The patient does not voice lifestyle limiting changes at this point in time.  Noninvasive studies do not suggest clinically significant change.  No invasive studies, angiography or surgery at this time The patient should continue walking and begin a more formal exercise program.  The patient should continue antiplatelet therapy and aggressive treatment of the lipid abnormalities  No changes in the patient's medications at this time  The patient should continue wearing graduated compression socks 10-15 mmHg strength to control the mild edema.    3. Bilateral carotid artery stenosis Recommend:  Given the patient's asymptomatic subcritical stenosis no further invasive testing or surgery at this time.  Duplex ultrasound shows <40% stenosis bilaterally.  Continue antiplatelet therapy as prescribed Continue management of CAD, HTN and Hyperlipidemia Healthy heart diet,  encouraged exercise at least 4 times per week Follow up in 24 months with duplex ultrasound and physical exam   4. Chronic obstructive pulmonary disease, unspecified COPD type (HCC) Continue pulmonary medications and aerosols as already ordered, these medications have been reviewed and there are no changes at this time.    5. Status post catheter ablation of atrial fibrillation Continue antiarrhythmia medications as already ordered, these medications have been reviewed and there are no changes at this time.  Continue anticoagulation as ordered by Cardiology Service     Levora DredgeGregory Skila Rollins, MD  06/01/2020 9:15 AM

## 2020-07-17 ENCOUNTER — Other Ambulatory Visit: Payer: Self-pay | Admitting: Pulmonary Disease

## 2020-07-17 ENCOUNTER — Ambulatory Visit
Admission: RE | Admit: 2020-07-17 | Discharge: 2020-07-17 | Disposition: A | Discharge status: Home / Self Care | Payer: Medicare Other | Source: Ambulatory Visit | Attending: Pulmonary Disease | Admitting: Pulmonary Disease

## 2020-07-17 ENCOUNTER — Other Ambulatory Visit: Payer: Self-pay

## 2020-07-17 DIAGNOSIS — R0602 Shortness of breath: Secondary | ICD-10-CM | POA: Insufficient documentation

## 2020-07-17 HISTORY — DX: Essential (primary) hypertension: I10

## 2020-07-17 LAB — POCT I-STAT CREATININE: Creatinine, Ser: 0.9 mg/dL (ref 0.44–1.00)

## 2020-07-17 MED ORDER — IOHEXOL 350 MG/ML SOLN
60.0000 mL | Freq: Once | INTRAVENOUS | Status: AC | PRN
  Administered 2020-07-17: 60 mL via INTRAVENOUS

## 2020-11-16 ENCOUNTER — Other Ambulatory Visit: Payer: Self-pay

## 2020-11-16 ENCOUNTER — Other Ambulatory Visit: Payer: Self-pay | Admitting: Family Medicine

## 2020-11-16 DIAGNOSIS — Z1231 Encounter for screening mammogram for malignant neoplasm of breast: Secondary | ICD-10-CM

## 2020-12-03 ENCOUNTER — Encounter: Payer: Medicare Other | Attending: Pulmonary Disease

## 2020-12-03 ENCOUNTER — Other Ambulatory Visit: Payer: Self-pay

## 2020-12-03 DIAGNOSIS — J439 Emphysema, unspecified: Secondary | ICD-10-CM | POA: Insufficient documentation

## 2020-12-03 NOTE — Progress Notes (Signed)
Virtual Visit completed. Patient informed on EP and RD appointment and 6 Minute walk test. Patient also informed of patient health questionnaires on My Chart. Patient Verbalizes understanding. Visit diagnosis can be found in [facility] 11/07/2020. 

## 2020-12-10 ENCOUNTER — Other Ambulatory Visit: Payer: Self-pay

## 2020-12-10 VITALS — Ht 63.0 in | Wt 114.6 lb

## 2020-12-10 DIAGNOSIS — J439 Emphysema, unspecified: Secondary | ICD-10-CM | POA: Diagnosis not present

## 2020-12-10 NOTE — Progress Notes (Signed)
Pulmonary Individual Treatment Plan  Patient Details  Name: Danielle Sandoval MRN: [id number] Date of Birth: [birth year] Referring Provider:   Flowsheet Row Pulmonary Rehab from 12/10/2020 in [facility] Cardiac and Pulmonary Rehab  Referring Provider Karna Christmas      Initial Encounter Date:  Flowsheet Row Pulmonary Rehab from 12/10/2020 in [facility] Cardiac and Pulmonary Rehab  Date 12/10/20      Visit Diagnosis: Pulmonary emphysema, unspecified emphysema type (HCC)  Patient's Home Medications on Admission:  Current Outpatient Medications:  .  acetaminophen (TYLENOL) 650 MG suppository, SMARTSIG:1 SUPPOS Rectally Every 4 Hours PRN (Patient not taking: Reported on 05/31/2020), Disp: , Rfl:  .  apixaban (ELIQUIS) 5 MG TABS tablet, , Disp: , Rfl:  .  Ascorbic Acid (VITAMIN C) 1000 MG tablet, SMARTSIG:By Mouth, Disp: , Rfl:  .  BREO ELLIPTA 200-25 MCG/INH AEPB, SMARTSIG:1 Inhalation Via Inhaler Daily, Disp: , Rfl:  .  clopidogrel (PLAVIX) 75 MG tablet, Take 75 mg by mouth daily., Disp: , Rfl:  .  COMBIVENT RESPIMAT 20-100 MCG/ACT AERS respimat, SMARTSIG:2 Inhalation Via Inhaler 4 Times Daily (Patient not taking: Reported on 12/03/2020), Disp: , Rfl:  .  DULCOLAX 5 MG EC tablet, Take 10 mg by mouth daily., Disp: , Rfl:  .  escitalopram (LEXAPRO) 5 MG tablet, Take 5 mg by mouth daily., Disp: , Rfl:  .  ferrous sulfate 325 (65 FE) MG EC tablet, Take 1 tablet by mouth daily., Disp: , Rfl:  .  fluticasone (FLONASE) 50 MCG/ACT nasal spray, Place into the nose., Disp: , Rfl:  .  furosemide (LASIX) 20 MG tablet, Take 20 mg by mouth daily., Disp: , Rfl:  .  gabapentin (NEURONTIN) 100 MG capsule, Take 100 mg by mouth 2 (two) times daily., Disp: , Rfl:  .  levothyroxine (SYNTHROID) 25 MCG tablet, Take 25 mcg by mouth daily., Disp: , Rfl:  .  LORazepam (ATIVAN) 0.5 MG tablet, Take 0.5 mg by mouth every 4 (four) hours as needed. (Patient not taking: Reported on 05/31/2020), Disp: , Rfl:  .  MELATONIN MAXIMUM STRENGTH 5 MG  TABS, SMARTSIG:1 Tablet(s) By Mouth PRN PRN, Disp: , Rfl:  .  metoprolol succinate (TOPROL-XL) 25 MG 24 hr tablet, , Disp: , Rfl:  .  MUCINEX MAXIMUM STRENGTH 1200 MG TB12, Take 1 tablet by mouth daily., Disp: , Rfl:  .  pantoprazole (PROTONIX) 40 MG tablet, Take 40 mg by mouth daily., Disp: , Rfl:  .  VITAMIN D-1000 MAX ST 25 MCG (1000 UT) tablet, Take 1,000 Units by mouth daily., Disp: , Rfl:   Past Medical History: Past Medical History:  Diagnosis Date  . Arthritis   . COPD (chronic obstructive pulmonary disease) (HCC)   . Emphysema of lung (HCC)   . Heart disease   . Hypertension   . Thyroid disease     Tobacco Use: Social History   Tobacco Use  Smoking Status Former Smoker  . Packs/day: 1.00  . Years: 45.00  . Pack years: 45.00  . Types: Cigarettes  . Quit date: 12/04/2007  . Years since quitting: 13.0  Smokeless Tobacco Never Used    Labs: Recent Review Flowsheet Data   There is no flowsheet data to display.      Pulmonary Assessment Scores:  Pulmonary Assessment Scores    Row Name 12/10/20 1558         ADL UCSD   SOB Score total 19     Rest 1     Walk 1     Stairs 5  Bath 1     Dress 2     Shop 2           CAT Score   CAT Score 10           mMRC Score   mMRC Score 2            UCSD: Self-administered rating of dyspnea associated with activities of daily living (ADLs) 6-point scale (0 = "not at all" to 5 = "maximal or unable to do because of breathlessness")  Scoring Scores range from 0 to 120.  Minimally important difference is 5 units  CAT: CAT can identify the health impairment of COPD patients and is better correlated with disease progression.  CAT has a scoring range of zero to 40. The CAT score is classified into four groups of low (less than 10), medium (10 - 20), high (21-30) and very high (31-40) based on the impact level of disease on health status. A CAT score over 10 suggests significant symptoms.  A worsening CAT score could  be explained by an exacerbation, poor medication adherence, poor inhaler technique, or progression of COPD or comorbid conditions.  CAT MCID is 2 points  mMRC: mMRC (Modified Medical Research Council) Dyspnea Scale is used to assess the degree of baseline functional disability in patients of respiratory disease due to dyspnea. No minimal important difference is established. A decrease in score of 1 point or greater is considered a positive change.   Pulmonary Function Assessment:  Pulmonary Function Assessment - 12/03/20 1409      Breath   Shortness of Breath Yes;Limiting activity           Exercise Target Goals: Exercise Program Goal: Individual exercise prescription set using results from initial 6 min walk test and THRR while considering  patient's activity barriers and safety.   Exercise Prescription Goal: Initial exercise prescription builds to 30-45 minutes a day of aerobic activity, 2-3 days per week.  Home exercise guidelines will be given to patient during program as part of exercise prescription that the participant will acknowledge.  Education: Aerobic Exercise: - Group verbal and visual presentation on the components of exercise prescription. Introduces F.I.T.T principle from ACSM for exercise prescriptions.  Reviews F.I.T.T. principles of aerobic exercise including progression. Written material given at graduation.   Education: Resistance Exercise: - Group verbal and visual presentation on the components of exercise prescription. Introduces F.I.T.T principle from ACSM for exercise prescriptions  Reviews F.I.T.T. principles of resistance exercise including progression. Written material given at graduation.    Education: Exercise & Equipment Safety: - Individual verbal instruction and demonstration of equipment use and safety with use of the equipment. Flowsheet Row Pulmonary Rehab from 12/10/2020 in [facility] Cardiac and Pulmonary Rehab  Date 12/10/20  Educator AS   Instruction Review Code 1- Verbalizes Understanding      Education: Exercise Physiology & General Exercise Guidelines: - Group verbal and written instruction with models to review the exercise physiology of the cardiovascular system and associated critical values. Provides general exercise guidelines with specific guidelines to those with heart or lung disease.    Education: Flexibility, Balance, Mind/Body Relaxation: - Group verbal and visual presentation with interactive activity on the components of exercise prescription. Introduces F.I.T.T principle from ACSM for exercise prescriptions. Reviews F.I.T.T. principles of flexibility and balance exercise training including progression. Also discusses the mind body connection.  Reviews various relaxation techniques to help reduce and manage stress (i.e. Deep breathing, progressive muscle relaxation, and visualization). Balance handout provided  to take home. Written material given at graduation.   Activity Barriers & Risk Stratification:   6 Minute Walk:  6 Minute Walk    Row Name 12/10/20 1552         6 Minute Walk   Phase Initial     Distance 400 feet     Walk Time 5.25 minutes     # of Rest Breaks 1     MPH 0.86     METS 0.94     RPE 11     Perceived Dyspnea  2     VO2 Peak 3.3           Oxygen Initial Assessment:  Oxygen Initial Assessment - 12/03/20 1408      Home Oxygen   Home Oxygen Device Home Concentrator;E-Tanks;Portable Concentrator    Sleep Oxygen Prescription Continuous    Liters per minute 4    Home Exercise Oxygen Prescription Continuous    Liters per minute 4    Home Resting Oxygen Prescription Continuous    Liters per minute 4    Compliance with Home Oxygen Use Yes      Initial 6 min Walk   Oxygen Used Continuous    Liters per minute 4      Program Oxygen Prescription   Program Oxygen Prescription Continuous    Liters per minute 4      Intervention   Short Term Goals To learn and exhibit  compliance with exercise, home and travel O2 prescription;To learn and understand importance of monitoring SPO2 with pulse oximeter and demonstrate accurate use of the pulse oximeter.;To learn and understand importance of maintaining oxygen saturations>88%;To learn and demonstrate proper pursed lip breathing techniques or other breathing techniques.;To learn and demonstrate proper use of respiratory medications    Long  Term Goals Exhibits compliance with exercise, home and travel O2 prescription;Verbalizes importance of monitoring SPO2 with pulse oximeter and return demonstration;Maintenance of O2 saturations>88%;Exhibits proper breathing techniques, such as pursed lip breathing or other method taught during program session;Compliance with respiratory medication;Demonstrates proper use of MDI's           Oxygen Re-Evaluation:   Oxygen Discharge (Final Oxygen Re-Evaluation):   Initial Exercise Prescription:  Initial Exercise Prescription - 12/10/20 1500      Date of Initial Exercise RX and Referring Provider   Date 12/10/20    Referring Provider Aleskerov      Oxygen   Oxygen Continuous    Liters 2-4      Treadmill   MPH 0.5    Grade 0    Minutes 15    METs 0.94      Recumbant Bike   Level 1    RPM 60    Minutes 15    METs 1      NuStep   Level 1    SPM 80    Minutes 15    METs 1      REL-XR   Level 1    Speed 50    Minutes 15    METs 1      Biostep-RELP   Level 1    SPM 50    Minutes 15    METs 1      Prescription Details   Frequency (times per week) 3    Duration Progress to 30 minutes of continuous aerobic without signs/symptoms of physical distress      Intensity   THRR 40-80% of Max Heartrate 99-127    Ratings of Perceived Exertion  11-15    Perceived Dyspnea 0-4      Resistance Training   Training Prescription Yes    Weight 2 lb    Reps 10-15           Perform Capillary Blood Glucose checks as needed.  Exercise Prescription Changes:   Exercise Prescription Changes    Row Name 12/10/20 1500             Response to Exercise   Blood Pressure (Admit) 130/64       Blood Pressure (Exercise) 116/66       Heart Rate (Admit) 73 bpm       Heart Rate (Exercise) 86 bpm       Heart Rate (Exit) 78 bpm       Oxygen Saturation (Admit) 96 %       Oxygen Saturation (Exercise) 90 %       Oxygen Saturation (Exit) 92 %       Rating of Perceived Exertion (Exercise) 11       Perceived Dyspnea (Exercise) 2              Exercise Comments:   Exercise Goals and Review:  Exercise Goals    Row Name 12/10/20 1556             Exercise Goals   Increase Physical Activity Yes       Intervention Provide advice, education, support and counseling about physical activity/exercise needs.;Develop an individualized exercise prescription for aerobic and resistive training based on initial evaluation findings, risk stratification, comorbidities and participant's personal goals.       Expected Outcomes Short Term: Attend rehab on a regular basis to increase amount of physical activity.;Long Term: Add in home exercise to make exercise part of routine and to increase amount of physical activity.;Long Term: Exercising regularly at least 3-5 days a week.       Increase Strength and Stamina Yes       Intervention Provide advice, education, support and counseling about physical activity/exercise needs.;Develop an individualized exercise prescription for aerobic and resistive training based on initial evaluation findings, risk stratification, comorbidities and participant's personal goals.       Expected Outcomes Short Term: Increase workloads from initial exercise prescription for resistance, speed, and METs.;Short Term: Perform resistance training exercises routinely during rehab and add in resistance training at home;Long Term: Improve cardiorespiratory fitness, muscular endurance and strength as measured by increased METs and functional capacity ( )        Able to understand and use rate of perceived exertion (RPE) scale Yes       Intervention Provide education and explanation on how to use RPE scale       Expected Outcomes Short Term: Able to use RPE daily in rehab to express subjective intensity level;Long Term:  Able to use RPE to guide intensity level when exercising independently       Able to understand and use Dyspnea scale Yes       Intervention Provide education and explanation on how to use Dyspnea scale       Expected Outcomes Short Term: Able to use Dyspnea scale daily in rehab to express subjective sense of shortness of breath during exertion;Long Term: Able to use Dyspnea scale to guide intensity level when exercising independently       Knowledge and understanding of Target Heart Rate Range (THRR) Yes       Intervention Provide education and explanation of THRR including how the numbers were predicted  and where they are located for reference       Expected Outcomes Short Term: Able to state/look up THRR;Short Term: Able to use daily as guideline for intensity in rehab;Long Term: Able to use THRR to govern intensity when exercising independently       Able to check pulse independently Yes       Intervention Provide education and demonstration on how to check pulse in carotid and radial arteries.;Review the importance of being able to check your own pulse for safety during independent exercise       Expected Outcomes Short Term: Able to explain why pulse checking is important during independent exercise;Long Term: Able to check pulse independently and accurately       Understanding of Exercise Prescription Yes       Intervention Provide education, explanation, and written materials on patient's individual exercise prescription       Expected Outcomes Short Term: Able to explain program exercise prescription;Long Term: Able to explain home exercise prescription to exercise independently              Exercise Goals Re-Evaluation  :   Discharge Exercise Prescription (Final Exercise Prescription Changes):  Exercise Prescription Changes - 12/10/20 1500      Response to Exercise   Blood Pressure (Admit) 130/64    Blood Pressure (Exercise) 116/66    Heart Rate (Admit) 73 bpm    Heart Rate (Exercise) 86 bpm    Heart Rate (Exit) 78 bpm    Oxygen Saturation (Admit) 96 %    Oxygen Saturation (Exercise) 90 %    Oxygen Saturation (Exit) 92 %    Rating of Perceived Exertion (Exercise) 11    Perceived Dyspnea (Exercise) 2           Nutrition:  Target Goals: Understanding of nutrition guidelines, daily intake of sodium 1500mg , cholesterol 200mg , calories 30% from fat and 7% or less from saturated fats, daily to have 5 or more servings of fruits and vegetables.  Education: All About Nutrition: -Group instruction provided by verbal, written material, interactive activities, discussions, models, and posters to present general guidelines for heart healthy nutrition including fat, fiber, MyPlate, the role of sodium in heart healthy nutrition, utilization of the nutrition label, and utilization of this knowledge for meal planning. Follow up email sent as well. Written material given at graduation.   Biometrics:  Pre Biometrics - 12/10/20 1556      Pre Biometrics   Height  (1.6 m)    Weight 114 lb 9.6 oz (52 kg)    BMI (Calculated) 20.31    Single Leg Stand 6.09 seconds            Nutrition Therapy Plan and Nutrition Goals:   Nutrition Assessments:  MEDIFICTS Score Key:  ?70 Need to make dietary changes   40-70 Heart Healthy Diet  ? 40 Therapeutic Level Cholesterol Diet  Flowsheet Row Pulmonary Rehab from 12/10/2020 in [facility] Cardiac and Pulmonary Rehab  Picture Your Plate Total Score on Admission 64     Picture Your Plate Scores:  <16 Unhealthy dietary pattern with much room for improvement.  41-50 Dietary pattern unlikely to meet recommendations for good health and room for  improvement.  51-60 More healthful dietary pattern, with some room for improvement.   >60 Healthy dietary pattern, although there may be some specific behaviors that could be improved.   Nutrition Goals Re-Evaluation:   Nutrition Goals Discharge (Final Nutrition Goals Re-Evaluation):   Psychosocial: Target Goals: Acknowledge  presence or absence of significant depression and/or stress, maximize coping skills, provide positive support system. Participant is able to verbalize types and ability to use techniques and skills needed for reducing stress and depression.   Education: Stress, Anxiety, and Depression - Group verbal and visual presentation to define topics covered.  Reviews how body is impacted by stress, anxiety, and depression.  Also discusses healthy ways to reduce stress and to treat/manage anxiety and depression.  Written material given at graduation.   Education: Sleep Hygiene -Provides group verbal and written instruction about how sleep can affect your health.  Define sleep hygiene, discuss sleep cycles and impact of sleep habits. Review good sleep hygiene tips.    Initial Review & Psychosocial Screening:  Initial Psych Review & Screening - 12/03/20 1414      Initial Review   Current issues with Current Psychotropic Meds;Current Sleep Concerns      Family Dynamics   Good Support System? Yes    Comments She misses her house in [place] and is now staying with her daughter. She can look to her daughter and son in-law for support. Her husband passed away in 03-Jan-2010. She has a positvie outlook on her health.      Barriers   Psychosocial barriers to participate in program The patient should benefit from training in stress management and relaxation.      Screening Interventions   Interventions To provide support and resources with identified psychosocial needs;Provide feedback about the scores to participant;Encouraged to exercise    Expected Outcomes Short Term goal: Utilizing  psychosocial counselor, staff and physician to assist with identification of specific Stressors or current issues interfering with healing process. Setting desired goal for each stressor or current issue identified.;Long Term Goal: Stressors or current issues are controlled or eliminated.;Short Term goal: Identification and review with participant of any Quality of Life or Depression concerns found by scoring the questionnaire.;Long Term goal: The participant improves quality of Life and PHQ9 Scores as seen by post scores and/or verbalization of changes           Quality of Life Scores:  Scores of 19 and below usually indicate a poorer quality of life in these areas.  A difference of  2-3 points is a clinically meaningful difference.  A difference of 2-3 points in the total score of the Quality of Life Index has been associated with significant improvement in overall quality of life, self-image, physical symptoms, and general health in studies assessing change in quality of life.  PHQ-9: Recent Review Flowsheet Data    Depression screen [facility] 2/9 12/10/2020   Decreased Interest 0   Down, Depressed, Hopeless 0   PHQ - 2 Score 0   Altered sleeping 3    Tired, decreased energy 1   Change in appetite 0   Feeling bad or failure about yourself  0   Trouble concentrating 2   Moving slowly or fidgety/restless 0   Suicidal thoughts 0   PHQ-9 Score 6   Difficult doing work/chores Not difficult at all     Interpretation of Total Score  Total Score Depression Severity:  1-4 = Minimal depression, 5-9 = Mild depression, 10-14 = Moderate depression, 15-19 = Moderately severe depression, 20-27 = Severe depression   Psychosocial Evaluation and Intervention:  Psychosocial Evaluation - 12/03/20 1418      Psychosocial Evaluation & Interventions   Interventions Encouraged to exercise with the program and follow exercise prescription;Relaxation education;Stress management education    Comments She misses  her house in [place] and is now staying with her daughter. She can look to her daughter and son in-law for support. Her husband passed away in Jan 19, 2010. She has a positvie outlook on her health.    Expected Outcomes Short: Exercise regularly to support mental health and notify staff of any changes. Long: maintain mental health and well being through teaching of rehab or prescribed medications independently.    Continue Psychosocial Services  Follow up required by staff           Psychosocial Re-Evaluation:   Psychosocial Discharge (Final Psychosocial Re-Evaluation):   Education: Education Goals: Education classes will be provided on a weekly basis, covering required topics. Participant will state understanding/return demonstration of topics presented.  Learning Barriers/Preferences:  Learning Barriers/Preferences - 12/03/20 1411      Learning Barriers/Preferences   Learning Barriers None    Learning Preferences None           General Pulmonary Education Topics:  Infection Prevention: - Provides verbal and written material to individual with discussion of infection control including proper hand washing and proper equipment cleaning during exercise session. Flowsheet Row Pulmonary Rehab from 12/10/2020 in [facility] Cardiac and Pulmonary Rehab  Date 12/10/20  Educator AS  Instruction Review Code 1- Verbalizes Understanding      Falls Prevention: - Provides verbal and written material to individual with discussion of falls prevention and safety. Flowsheet Row Pulmonary Rehab from 12/10/2020 in [facility] Cardiac and Pulmonary Rehab  Date 12/10/20  Educator AS  Instruction Review Code 1- Verbalizes Understanding      Chronic Lung Disease Review: - Group verbal instruction with posters, models, PowerPoint presentations and videos,  to review new updates, new respiratory medications, new advancements in procedures and treatments. Providing information on websites and "800" numbers for continued  self-education. Includes information about supplement oxygen, available portable oxygen systems, continuous and intermittent flow rates, oxygen safety, concentrators, and Medicare reimbursement for oxygen. Explanation of Pulmonary Drugs, including class, frequency, complications, importance of spacers, rinsing mouth after steroid MDI's, and proper cleaning methods for nebulizers. Review of basic lung anatomy and physiology related to function, structure, and complications of lung disease. Review of risk factors. Discussion about methods for diagnosing sleep apnea and types of masks and machines for OSA. Includes a review of the use of types of environmental controls: home humidity, furnaces, filters, dust mite/pet prevention, HEPA vacuums. Discussion about weather changes, air quality and the benefits of nasal washing. Instruction on Warning signs, infection symptoms, calling MD promptly, preventive modes, and value of vaccinations. Review of effective airway clearance, coughing and/or vibration techniques. Emphasizing that all should Create an Action Plan. Written material given at graduation.   AED/CPR: - Group verbal and written instruction with the use of models to demonstrate the basic use of the AED with the basic ABC's of resuscitation.    Anatomy and Cardiac Procedures: - Group verbal and visual presentation and models provide information about basic cardiac anatomy and function. Reviews the testing methods done to diagnose heart disease and the outcomes of the test results. Describes the treatment choices: Medical Management, Angioplasty, or Coronary Bypass Surgery for treating various heart conditions including Myocardial Infarction, Angina, Valve Disease, and Cardiac Arrhythmias.  Written material given at graduation.   Medication Safety: - Group verbal and visual instruction to review commonly prescribed medications for heart and lung disease. Reviews the medication, class of the drug, and  side effects. Includes the steps to properly store meds and maintain the prescription regimen.  Written material given at graduation.   Other: -Provides group and verbal instruction on various topics (see comments)   Knowledge Questionnaire Score:  Knowledge Questionnaire Score - 12/10/20 1559      Knowledge Questionnaire Score   Pre Score 9/18            Core Components/Risk Factors/Patient Goals at Admission:  Personal Goals and Risk Factors at Admission - 12/10/20 1602      Core Components/Risk Factors/Patient Goals on Admission    Weight Management Yes;Weight Maintenance    Intervention Weight Management: Develop a combined nutrition and exercise program designed to reach desired caloric intake, while maintaining appropriate intake of nutrient and fiber, sodium and fats, and appropriate energy expenditure required for the weight goal.;Weight Management: Provide education and appropriate resources to help participant work on and attain dietary goals.;Weight Management/Obesity: Establish reasonable short term and long term weight goals.    Expected Outcomes Short Term: Continue to assess and modify interventions until short term weight is achieved;Long Term: Adherence to nutrition and physical activity/exercise program aimed toward attainment of established weight goal;Weight Maintenance: Understanding of the daily nutrition guidelines, which includes 25-35% calories from fat, 7% or less cal from saturated fats, less than 200mg  cholesterol, less than 1.5gm of sodium, & 5 or more servings of fruits and vegetables daily;Understanding recommendations for meals to include 15-35% energy as protein, 25-35% energy from fat, 35-60% energy from carbohydrates, less than 200mg  of dietary cholesterol, 20-35 gm of total fiber daily;Understanding of distribution of calorie intake throughout the day with the consumption of 4-5 meals/snacks    Improve shortness of breath with ADL's Yes    Intervention  Provide education, individualized exercise plan and daily activity instruction to help decrease symptoms of SOB with activities of daily living.    Expected Outcomes Short Term: Improve cardiorespiratory fitness to achieve a reduction of symptoms when performing ADLs;Long Term: Be able to perform more ADLs without symptoms or delay the onset of symptoms           Education:Diabetes - Individual verbal and written instruction to review signs/symptoms of diabetes, desired ranges of glucose level fasting, after meals and with exercise. Acknowledge that pre and post exercise glucose checks will be done for 3 sessions at entry of program.   Know Your Numbers and Heart Failure: - Group verbal and visual instruction to discuss disease risk factors for cardiac and pulmonary disease and treatment options.  Reviews associated critical values for Overweight/Obesity, Hypertension, Cholesterol, and Diabetes.  Discusses basics of heart failure: signs/symptoms and treatments.  Introduces Heart Failure Zone chart for action plan for heart failure.  Written material given at graduation.   Core Components/Risk Factors/Patient Goals Review:    Core Components/Risk Factors/Patient Goals at Discharge (Final Review):    ITP Comments:  ITP Comments    Row Name 12/03/20 1410 12/03/20 1419         ITP Comments Virtual Visit completed. Patient informed on EP and RD appointment and 6 Minute walk test. Patient also informed of patient health questionnaires on My Chart. Patient Verbalizes understanding. [facility]. Patient will bring her medication list when she comes with her daughter.             Comments: initial ITP

## 2020-12-10 NOTE — Patient Instructions (Signed)
Patient Instructions  Patient Details  Name: Danielle Sandoval MRN: [id number] Date of Birth: 03/24/1941 Referring Provider:  Vida Rigger, MD  Below are your personal goals for exercise, nutrition, and risk factors. Our goal is to help you stay on track towards obtaining and maintaining these goals. We will be discussing your progress on these goals with you throughout the program.  Initial Exercise Prescription:  Initial Exercise Prescription - 12/10/20 1500      Date of Initial Exercise RX and Referring Provider   Date 12/10/20    Referring Provider Aleskerov      Oxygen   Oxygen Continuous    Liters 2-4      Treadmill   MPH 0.5    Grade 0    Minutes 15    METs 0.94      Recumbant Bike   Level 1    RPM 60    Minutes 15    METs 1      NuStep   Level 1    SPM 80    Minutes 15    METs 1      REL-XR   Level 1    Speed 50    Minutes 15    METs 1      Biostep-RELP   Level 1    SPM 50    Minutes 15    METs 1      Prescription Details   Frequency (times per week) 3    Duration Progress to 30 minutes of continuous aerobic without signs/symptoms of physical distress      Intensity   THRR 40-80% of Max Heartrate 99-127    Ratings of Perceived Exertion 11-15    Perceived Dyspnea 0-4      Resistance Training   Training Prescription Yes    Weight 2 lb    Reps 10-15           Exercise Goals: Frequency: Be able to perform aerobic exercise two to three times per week in program working toward 2-5 days per week of home exercise.  Intensity: Work with a perceived exertion of 11 (fairly light) - 15 (hard) while following your exercise prescription.  We will make changes to your prescription with you as you progress through the program.   Duration: Be able to do 30 to 45 minutes of continuous aerobic exercise in addition to a 5 minute warm-up and a 5 minute cool-down routine.   Nutrition Goals: Your personal nutrition goals will be established when you do your  nutrition analysis with the dietician.  The following are general nutrition guidelines to follow: Cholesterol < 200mg /day Sodium < 1500mg /day Fiber: Women over 50 yrs - 21 grams per day  Personal Goals:  Personal Goals and Risk Factors at Admission - 12/10/20 1602      Core Components/Risk Factors/Patient Goals on Admission    Weight Management Yes;Weight Maintenance    Intervention Weight Management: Develop a combined nutrition and exercise program designed to reach desired caloric intake, while maintaining appropriate intake of nutrient and fiber, sodium and fats, and appropriate energy expenditure required for the weight goal.;Weight Management: Provide education and appropriate resources to help participant work on and attain dietary goals.;Weight Management/Obesity: Establish reasonable short term and long term weight goals.    Expected Outcomes Short Term: Continue to assess and modify interventions until short term weight is achieved;Long Term: Adherence to nutrition and physical activity/exercise program aimed toward attainment of established weight goal;Weight Maintenance: Understanding of the daily nutrition guidelines,  which includes 25-35% calories from fat, 7% or less cal from saturated fats, less than 200mg  cholesterol, less than 1.5gm of sodium, & 5 or more servings of fruits and vegetables daily;Understanding recommendations for meals to include 15-35% energy as protein, 25-35% energy from fat, 35-60% energy from carbohydrates, less than 200mg  of dietary cholesterol, 20-35 gm of total fiber daily;Understanding of distribution of calorie intake throughout the day with the consumption of 4-5 meals/snacks    Improve shortness of breath with ADL's Yes    Intervention Provide education, individualized exercise plan and daily activity instruction to help decrease symptoms of SOB with activities of daily living.    Expected Outcomes Short Term: Improve cardiorespiratory fitness to achieve a  reduction of symptoms when performing ADLs;Long Term: Be able to perform more ADLs without symptoms or delay the onset of symptoms           Tobacco Use Initial Evaluation: Social History   Tobacco Use  Smoking Status Former Smoker  . Packs/day: 1.00  . Years: 45.00  . Pack years: 45.00  . Types: Cigarettes  . Quit date: 12/04/2007  . Years since quitting: 13.0  Smokeless Tobacco Never Used    Exercise Goals and Review:  Exercise Goals    Row Name 12/10/20 1556             Exercise Goals   Increase Physical Activity Yes       Intervention Provide advice, education, support and counseling about physical activity/exercise needs.;Develop an individualized exercise prescription for aerobic and resistive training based on initial evaluation findings, risk stratification, comorbidities and participant's personal goals.       Expected Outcomes Short Term: Attend rehab on a regular basis to increase amount of physical activity.;Long Term: Add in home exercise to make exercise part of routine and to increase amount of physical activity.;Long Term: Exercising regularly at least 3-5 days a week.       Increase Strength and Stamina Yes       Intervention Provide advice, education, support and counseling about physical activity/exercise needs.;Develop an individualized exercise prescription for aerobic and resistive training based on initial evaluation findings, risk stratification, comorbidities and participant's personal goals.       Expected Outcomes Short Term: Increase workloads from initial exercise prescription for resistance, speed, and METs.;Short Term: Perform resistance training exercises routinely during rehab and add in resistance training at home;Long Term: Improve cardiorespiratory fitness, muscular endurance and strength as measured by increased METs and functional capacity (12/06/2007)       Able to understand and use rate of perceived exertion (RPE) scale Yes       Intervention  Provide education and explanation on how to use RPE scale       Expected Outcomes Short Term: Able to use RPE daily in rehab to express subjective intensity level;Long Term:  Able to use RPE to guide intensity level when exercising independently       Able to understand and use Dyspnea scale Yes       Intervention Provide education and explanation on how to use Dyspnea scale       Expected Outcomes Short Term: Able to use Dyspnea scale daily in rehab to express subjective sense of shortness of breath during exertion;Long Term: Able to use Dyspnea scale to guide intensity level when exercising independently       Knowledge and understanding of Target Heart Rate Range (THRR) Yes       Intervention Provide education and explanation of THRR  including how the numbers were predicted and where they are located for reference       Expected Outcomes Short Term: Able to state/look up THRR;Short Term: Able to use daily as guideline for intensity in rehab;Long Term: Able to use THRR to govern intensity when exercising independently       Able to check pulse independently Yes       Intervention Provide education and demonstration on how to check pulse in carotid and radial arteries.;Review the importance of being able to check your own pulse for safety during independent exercise       Expected Outcomes Short Term: Able to explain why pulse checking is important during independent exercise;Long Term: Able to check pulse independently and accurately       Understanding of Exercise Prescription Yes       Intervention Provide education, explanation, and written materials on patient's individual exercise prescription       Expected Outcomes Short Term: Able to explain program exercise prescription;Long Term: Able to explain home exercise prescription to exercise independently              Copy of goals given to participant.

## 2020-12-12 ENCOUNTER — Other Ambulatory Visit: Payer: Self-pay

## 2020-12-12 DIAGNOSIS — J439 Emphysema, unspecified: Secondary | ICD-10-CM

## 2020-12-12 NOTE — Progress Notes (Signed)
Daily Session Note  Patient Details  Name: Keslie Gritz MRN: [id number] Date of Birth: [birth year] Referring Provider:   Flowsheet Row Pulmonary Rehab from 12/10/2020 in [facility] Cardiac and Pulmonary Rehab  Referring Provider Lanney Gins      Encounter Date: 12/12/2020  Check In:  Session Check In - 12/12/20 1345      Check-In   Supervising physician immediately available to respond to emergencies See telemetry face sheet for immediately available ER MD    Location [facility]-Cardiac & Pulmonary Rehab    Staff Present Birdie Sons, MPA, RN;Melissa Caiola RDN, LDN;Susanne Bice, RN, BSN, CCRP;Jessica Thurman, MA, RCEP, CCRP, CCET    Virtual Visit No    Medication changes reported     No    Fall or balance concerns reported    No    Warm-up and Cool-down Performed on first and last piece of equipment    Resistance Training Performed Yes    VAD Patient? No    PAD/SET Patient? No      Pain Assessment   Currently in Pain? No/denies              Social History   Tobacco Use  Smoking Status Former Smoker  . Packs/day: 1.00  . Years: 45.00  . Pack years: 45.00  . Types: Cigarettes  . Quit date: 12/04/2007  . Years since quitting: 13.0  Smokeless Tobacco Never Used    Goals Met:  Independence with exercise equipment Exercise tolerated well No report of cardiac concerns or symptoms Strength training completed today  Goals Unmet:  Not Applicable  Comments: First full day of exercise!  Patient was oriented to gym and equipment including functions, settings, policies, and procedures.  Patient's individual exercise prescription and treatment plan were reviewed.  All starting workloads were established based on the results of the 6 minute walk test done at initial orientation visit.  The plan for exercise progression was also introduced and progression will be customized based on patient's performance and goals.    Dr. Emily Filbert is Medical Director for [place] and [facility].

## 2020-12-13 ENCOUNTER — Encounter: Payer: Medicare Other | Admitting: *Deleted

## 2020-12-13 DIAGNOSIS — J439 Emphysema, unspecified: Secondary | ICD-10-CM

## 2020-12-13 NOTE — Progress Notes (Signed)
Daily Session Note  Patient Details  Name: Danielle Sandoval MRN: [id number] Date of Birth: 12/21/1940 Referring Provider:   Flowsheet Row Pulmonary Rehab from 12/10/2020 in [facility] Cardiac and Pulmonary Rehab  Referring Provider Aleskerov      Encounter Date: 12/13/2020  Check In:  Session Check In - 12/13/20 1325      Check-In   Supervising physician immediately available to respond to emergencies See telemetry face sheet for immediately available ER MD    Location [facility]-Cardiac & Pulmonary Rehab    Staff Present Meredith Craven, RN BSN;Joseph Hood RCP,RRT,BSRT;Jessica Hawkins, MA, RCEP, CCRP, CCET    Virtual Visit No    Medication changes reported     No    Fall or balance concerns reported    No    Warm-up and Cool-down Performed on first and last piece of equipment    Resistance Training Performed Yes    VAD Patient? No    PAD/SET Patient? No      Pain Assessment   Currently in Pain? No/denies              Social History   Tobacco Use  Smoking Status Former Smoker  . Packs/day: 1.00  . Years: 45.00  . Pack years: 45.00  . Types: Cigarettes  . Quit date: 12/04/2007  . Years since quitting: 13.0  Smokeless Tobacco Never Used    Goals Met:  Independence with exercise equipment Exercise tolerated well No report of cardiac concerns or symptoms Strength training completed today  Goals Unmet:  Not Applicable  Comments: Pt able to follow exercise prescription today without complaint.  Will continue to monitor for progression.    Dr. Mark Miller is Medical Director for [facility] and [facility]. 

## 2020-12-17 ENCOUNTER — Other Ambulatory Visit: Payer: Self-pay

## 2020-12-17 ENCOUNTER — Encounter: Payer: Medicare Other | Attending: Pulmonary Disease

## 2020-12-17 DIAGNOSIS — J439 Emphysema, unspecified: Secondary | ICD-10-CM | POA: Diagnosis not present

## 2020-12-17 NOTE — Progress Notes (Signed)
Daily Session Note  Patient Details  Name: Danielle Sandoval MRN: [id number] Date of Birth: 08/22/1941 Referring Provider:   Flowsheet Row Pulmonary Rehab from 12/10/2020 in [facility] Cardiac and Pulmonary Rehab  Referring Provider Lanney Gins      Encounter Date: 12/17/2020  Check In:  Session Check In - 12/17/20 1333      Check-In   Supervising physician immediately available to respond to emergencies See telemetry face sheet for immediately available ER MD    Location [facility]-Cardiac & Pulmonary Rehab    Staff Present Birdie Sons, MPA, Mauricia Area, BS, ACSM CEP, Exercise Physiologist;Kara Eliezer Bottom, MS Exercise Physiologist    Virtual Visit No    Medication changes reported     No    Fall or balance concerns reported    No    Warm-up and Cool-down Performed on first and last piece of equipment    Resistance Training Performed Yes    VAD Patient? No    PAD/SET Patient? No      Pain Assessment   Currently in Pain? No/denies              Social History   Tobacco Use  Smoking Status Former Smoker  . Packs/day: 1.00  . Years: 45.00  . Pack years: 45.00  . Types: Cigarettes  . Quit date: 12/04/2007  . Years since quitting: 13.0  Smokeless Tobacco Never Used    Goals Met:  Independence with exercise equipment Exercise tolerated well No report of cardiac concerns or symptoms Strength training completed today  Goals Unmet:  Not Applicable  Comments: Pt able to follow exercise prescription today without complaint.  Will continue to monitor for progression.    Dr. Emily Filbert is Medical Director for [place] and [facility].

## 2020-12-18 ENCOUNTER — Other Ambulatory Visit: Payer: Self-pay

## 2020-12-18 ENCOUNTER — Ambulatory Visit
Admission: RE | Admit: 2020-12-18 | Discharge: 2020-12-18 | Disposition: A | Discharge status: Home / Self Care | Payer: Medicare Other | Source: Ambulatory Visit | Attending: Family Medicine | Admitting: Family Medicine

## 2020-12-18 DIAGNOSIS — Z1231 Encounter for screening mammogram for malignant neoplasm of breast: Secondary | ICD-10-CM | POA: Diagnosis not present

## 2020-12-19 DIAGNOSIS — J439 Emphysema, unspecified: Secondary | ICD-10-CM

## 2020-12-19 NOTE — Progress Notes (Signed)
Daily Session Note  Patient Details  Name: Danielle Sandoval MRN: [id number] Date of Birth: [birth year] Referring Provider:   Flowsheet Row Pulmonary Rehab from 12/10/2020 in [facility] Cardiac and Pulmonary Rehab  Referring Provider Lanney Gins      Encounter Date: 12/19/2020  Check In:  Session Check In - 12/19/20 1343      Check-In   Supervising physician immediately available to respond to emergencies See telemetry face sheet for immediately available ER MD    Location [facility]-Cardiac & Pulmonary Rehab    Staff Present Birdie Sons, MPA, RN;Joseph Lou Miner, [place] Exercise Physiologist;Meredith Sherryll Burger, RN BSN    Virtual Visit No    Medication changes reported     No    Fall or balance concerns reported    No    Warm-up and Cool-down Performed on first and last piece of equipment    Resistance Training Performed Yes    VAD Patient? No    PAD/SET Patient? No      Pain Assessment   Currently in Pain? No/denies              Social History   Tobacco Use  Smoking Status Former Smoker  . Packs/day: 1.00  . Years: 45.00  . Pack years: 45.00  . Types: Cigarettes  . Quit date: 12/04/2007  . Years since quitting: 13.0  Smokeless Tobacco Never Used    Goals Met:  Independence with exercise equipment Exercise tolerated well No report of cardiac concerns or symptoms Strength training completed today  Goals Unmet:  Not Applicable  Comments: Pt able to follow exercise prescription today without complaint.  Will continue to monitor for progression.    Dr. Emily Filbert is Medical Director for [place] and [facility].

## 2020-12-20 ENCOUNTER — Other Ambulatory Visit: Payer: Self-pay

## 2020-12-20 ENCOUNTER — Encounter: Payer: Medicare Other | Admitting: *Deleted

## 2020-12-20 DIAGNOSIS — J439 Emphysema, unspecified: Secondary | ICD-10-CM

## 2020-12-20 NOTE — Progress Notes (Signed)
Daily Session Note  Patient Details  Name: Danielle Sandoval MRN: [id number] Date of Birth: [birth year] Referring Provider:   Flowsheet Row Pulmonary Rehab from 12/10/2020 in [facility] Cardiac and Pulmonary Rehab  Referring Provider Lanney Gins      Encounter Date: 12/20/2020  Check In:  Session Check In - 12/20/20 1348      Check-In   Supervising physician immediately available to respond to emergencies See telemetry face sheet for immediately available ER MD    Location [facility]-Cardiac & Pulmonary Rehab    Staff Present Renita Papa, RN BSN;Joseph [address] [place], [place], [place], CCRP, CCET    Virtual Visit No    Medication changes reported     No    Fall or balance concerns reported    No    Warm-up and Cool-down Performed on first and last piece of equipment    Resistance Training Performed Yes    VAD Patient? No    PAD/SET Patient? No      Pain Assessment   Currently in Pain? No/denies              Social History   Tobacco Use  Smoking Status Former Smoker  . Packs/day: 1.00  . Years: 45.00  . Pack years: 45.00  . Types: Cigarettes  . Quit date: 12/04/2007  . Years since quitting: 13.0  Smokeless Tobacco Never Used    Goals Met:  Independence with exercise equipment Exercise tolerated well No report of cardiac concerns or symptoms Strength training completed today  Goals Unmet:  Not Applicable  Comments: Pt able to follow exercise prescription today without complaint.  Will continue to monitor for progression.    Dr. Emily Filbert is Medical Director for [place] and [facility].

## 2020-12-26 ENCOUNTER — Other Ambulatory Visit: Payer: Self-pay

## 2020-12-26 DIAGNOSIS — J439 Emphysema, unspecified: Secondary | ICD-10-CM

## 2020-12-26 NOTE — Progress Notes (Signed)
Daily Session Note  Patient Details  Name: Danielle Sandoval MRN: [id number] Date of Birth: 05-06-1941 Referring Provider:   Flowsheet Row Pulmonary Rehab from 12/10/2020 in [facility] Cardiac and Pulmonary Rehab  Referring Provider Lanney Gins      Encounter Date: 12/26/2020  Check In:  Session Check In - 12/26/20 1347      Check-In   Supervising physician immediately available to respond to emergencies See telemetry face sheet for immediately available ER MD    Location [facility]-Cardiac & Pulmonary Rehab    Staff Present Birdie Sons, MPA, RN;Joseph Lou Miner, [place] Exercise Physiologist    Virtual Visit No    Medication changes reported     No    Fall or balance concerns reported    No    Warm-up and Cool-down Performed on first and last piece of equipment    Resistance Training Performed Yes    VAD Patient? No    PAD/SET Patient? No      Pain Assessment   Currently in Pain? No/denies              Social History   Tobacco Use  Smoking Status Former Smoker  . Packs/day: 1.00  . Years: 45.00  . Pack years: 45.00  . Types: Cigarettes  . Quit date: 12/04/2007  . Years since quitting: 13.0  Smokeless Tobacco Never Used    Goals Met:  Independence with exercise equipment Exercise tolerated well No report of cardiac concerns or symptoms Strength training completed today  Goals Unmet:  Not Applicable  Comments: Pt able to follow exercise prescription today without complaint.  Will continue to monitor for progression.    Dr. Emily Filbert is Medical Director for [place] and [facility].

## 2020-12-27 ENCOUNTER — Other Ambulatory Visit: Payer: Self-pay

## 2020-12-27 ENCOUNTER — Encounter: Payer: Medicare Other | Admitting: *Deleted

## 2020-12-27 DIAGNOSIS — J439 Emphysema, unspecified: Secondary | ICD-10-CM | POA: Diagnosis not present

## 2020-12-27 NOTE — Progress Notes (Signed)
Daily Session Note  Patient Details  Name: Danielle Sandoval MRN: [id number] Date of Birth: [birth year] Referring Provider:   Flowsheet Row Pulmonary Rehab from 12/10/2020 in [facility] Cardiac and Pulmonary Rehab  Referring Provider Lanney Gins      Encounter Date: 12/27/2020  Check In:  Session Check In - 12/27/20 1332      Check-In   Supervising physician immediately available to respond to emergencies See telemetry face sheet for immediately available ER MD    Location [facility]-Cardiac & Pulmonary Rehab    Staff Present Renita Papa, RN BSN;Joseph [address] [place], [place], [place], CCRP, CCET    Virtual Visit No    Medication changes reported     No    Fall or balance concerns reported    No    Warm-up and Cool-down Performed on first and last piece of equipment    Resistance Training Performed Yes    VAD Patient? No    PAD/SET Patient? No      Pain Assessment   Currently in Pain? No/denies              Social History   Tobacco Use  Smoking Status Former Smoker  . Packs/day: 1.00  . Years: 45.00  . Pack years: 45.00  . Types: Cigarettes  . Quit date: 12/04/2007  . Years since quitting: 13.0  Smokeless Tobacco Never Used    Goals Met:  Independence with exercise equipment Exercise tolerated well No report of cardiac concerns or symptoms Strength training completed today  Goals Unmet:  Not Applicable  Comments: Pt able to follow exercise prescription today without complaint.  Will continue to monitor for progression.    Dr. Emily Filbert is Medical Director for [place] and [facility].

## 2020-12-31 ENCOUNTER — Encounter: Payer: Medicare Other | Admitting: *Deleted

## 2020-12-31 ENCOUNTER — Other Ambulatory Visit: Payer: Self-pay

## 2020-12-31 DIAGNOSIS — J439 Emphysema, unspecified: Secondary | ICD-10-CM

## 2020-12-31 NOTE — Progress Notes (Signed)
Daily Session Note  Patient Details  Name: Danielle Sandoval MRN: [id number] Date of Birth: 06-14-1941 Referring Provider:   Flowsheet Row Pulmonary Rehab [facility]  Referring Provider Lanney Gins      Encounter Date: 12/31/2020  Check In:  Session Check In - 12/31/20 1350      Check-In   Supervising physician immediately available to respond to emergencies See telemetry face sheet for immediately available ER MD    Location [facility]-Cardiac & Pulmonary Rehab    Staff Present Heath Lark, RN, BSN, CCRP;Joseph Hood RCP,RRT,BSRT;Kelly [place], [place], ACSM CEP, Exercise Physiologist    Virtual Visit No    Medication changes reported     No    Fall or balance concerns reported    No    Warm-up and Cool-down Performed on first and last piece of equipment    Resistance Training Performed Yes    VAD Patient? No    PAD/SET Patient? No      Pain Assessment   Currently in Pain? No/denies              Social History   Tobacco Use  Smoking Status Former Smoker  . Packs/day: 1.00  . Years: 45.00  . Pack years: 45.00  . Types: Cigarettes  . Quit date: 12/04/2007  . Years since quitting: 13.0  Smokeless Tobacco Never Used    Goals Met:  Proper associated with RPD/PD & O2 Sat Independence with exercise equipment Exercise tolerated well No report of cardiac concerns or symptoms  Goals Unmet:  Not Applicable  Comments: Pt able to follow exercise prescription today without complaint.  Will continue to monitor for progression.    Dr. Emily Filbert is Medical Director for [place] and [facility].

## 2021-01-02 ENCOUNTER — Other Ambulatory Visit: Payer: Self-pay

## 2021-01-02 ENCOUNTER — Encounter: Payer: Self-pay | Admitting: *Deleted

## 2021-01-02 ENCOUNTER — Encounter: Payer: Medicare Other | Admitting: *Deleted

## 2021-01-02 DIAGNOSIS — J439 Emphysema, unspecified: Secondary | ICD-10-CM

## 2021-01-02 NOTE — Progress Notes (Signed)
Pulmonary Individual Treatment Plan  Patient Details  Name: Danielle Sandoval MRN: [id number] Date of Birth: [birth year] Referring Provider:   Flowsheet Row Pulmonary Rehab from 12/10/2020 in [facility] Cardiac and Pulmonary Rehab  Referring Provider Karna Christmas      Initial Encounter Date:  Flowsheet Row Pulmonary Rehab from 12/10/2020 in [facility] Cardiac and Pulmonary Rehab  Date 12/10/20      Visit Diagnosis: Pulmonary emphysema, unspecified emphysema type (HCC)  Patient's Home Medications on Admission:  Current Outpatient Medications:  .  acetaminophen (TYLENOL) 650 MG suppository, SMARTSIG:1 SUPPOS Rectally Every 4 Hours PRN (Patient not taking: Reported on 05/31/2020), Disp: , Rfl:  .  apixaban (ELIQUIS) 5 MG TABS tablet, , Disp: , Rfl:  .  Ascorbic Acid (VITAMIN C) 1000 MG tablet, SMARTSIG:By Mouth, Disp: , Rfl:  .  BREO ELLIPTA 200-25 MCG/INH AEPB, SMARTSIG:1 Inhalation Via Inhaler Daily, Disp: , Rfl:  .  clopidogrel (PLAVIX) 75 MG tablet, Take 75 mg by mouth daily., Disp: , Rfl:  .  COMBIVENT RESPIMAT 20-100 MCG/ACT AERS respimat, SMARTSIG:2 Inhalation Via Inhaler 4 Times Daily (Patient not taking: Reported on 12/03/2020), Disp: , Rfl:  .  DULCOLAX 5 MG EC tablet, Take 10 mg by mouth daily., Disp: , Rfl:  .  escitalopram (LEXAPRO) 5 MG tablet, Take 5 mg by mouth daily., Disp: , Rfl:  .  ferrous sulfate 325 (65 FE) MG EC tablet, Take 1 tablet by mouth daily., Disp: , Rfl:  .  fluticasone (FLONASE) 50 MCG/ACT nasal spray, Place into the nose., Disp: , Rfl:  .  furosemide (LASIX) 20 MG tablet, Take 20 mg by mouth daily., Disp: , Rfl:  .  gabapentin (NEURONTIN) 100 MG capsule, Take 100 mg by mouth 2 (two) times daily., Disp: , Rfl:  .  levothyroxine (SYNTHROID) 25 MCG tablet, Take 25 mcg by mouth daily., Disp: , Rfl:  .  LORazepam (ATIVAN) 0.5 MG tablet, Take 0.5 mg by mouth every 4 (four) hours as needed. (Patient not taking: Reported on 05/31/2020), Disp: , Rfl:  .  MELATONIN MAXIMUM STRENGTH 5 MG  TABS, SMARTSIG:1 Tablet(s) By Mouth PRN PRN, Disp: , Rfl:  .  metoprolol succinate (TOPROL-XL) 25 MG 24 hr tablet, , Disp: , Rfl:  .  MUCINEX MAXIMUM STRENGTH 1200 MG TB12, Take 1 tablet by mouth daily., Disp: , Rfl:  .  pantoprazole (PROTONIX) 40 MG tablet, Take 40 mg by mouth daily., Disp: , Rfl:  .  VITAMIN D-1000 MAX ST 25 MCG (1000 UT) tablet, Take 1,000 Units by mouth daily., Disp: , Rfl:   Past Medical History: Past Medical History:  Diagnosis Date  . Arthritis   . COPD (chronic obstructive pulmonary disease) (HCC)   . Emphysema of lung (HCC)   . Heart disease   . Hypertension   . Thyroid disease     Tobacco Use: Social History   Tobacco Use  Smoking Status Former Smoker  . Packs/day: 1.00  . Years: 45.00  . Pack years: 45.00  . Types: Cigarettes  . Quit date: 12/04/2007  . Years since quitting: 13.0  Smokeless Tobacco Never Used    Labs: Recent Review Flowsheet Data   There is no flowsheet data to display.      Pulmonary Assessment Scores:  Pulmonary Assessment Scores    Row Name 12/10/20 1558         ADL UCSD   SOB Score total 19     Rest 1     Walk 1     Stairs 5  Bath 1     Dress 2     Shop 2           CAT Score   CAT Score 10           mMRC Score   mMRC Score 2            UCSD: Self-administered rating of dyspnea associated with activities of daily living (ADLs) 6-point scale (0 = "not at all" to 5 = "maximal or unable to do because of breathlessness")  Scoring Scores range from 0 to 120.  Minimally important difference is 5 units  CAT: CAT can identify the health impairment of COPD patients and is better correlated with disease progression.  CAT has a scoring range of zero to 40. The CAT score is classified into four groups of low (less than 10), medium (10 - 20), high (21-30) and very high (31-40) based on the impact level of disease on health status. A CAT score over 10 suggests significant symptoms.  A worsening CAT score could  be explained by an exacerbation, poor medication adherence, poor inhaler technique, or progression of COPD or comorbid conditions.  CAT MCID is 2 points  mMRC: mMRC (Modified Medical Research Council) Dyspnea Scale is used to assess the degree of baseline functional disability in patients of respiratory disease due to dyspnea. No minimal important difference is established. A decrease in score of 1 point or greater is considered a positive change.   Pulmonary Function Assessment:  Pulmonary Function Assessment - 12/03/20 1409      Breath   Shortness of Breath Yes;Limiting activity           Exercise Target Goals: Exercise Program Goal: Individual exercise prescription set using results from initial 6 min walk test and THRR while considering  patient's activity barriers and safety.   Exercise Prescription Goal: Initial exercise prescription builds to 30-45 minutes a day of aerobic activity, 2-3 days per week.  Home exercise guidelines will be given to patient during program as part of exercise prescription that the participant will acknowledge.  Education: Aerobic Exercise: - Group verbal and visual presentation on the components of exercise prescription. Introduces F.I.T.T principle from ACSM for exercise prescriptions.  Reviews F.I.T.T. principles of aerobic exercise including progression. Written material given at graduation.   Education: Resistance Exercise: - Group verbal and visual presentation on the components of exercise prescription. Introduces F.I.T.T principle from ACSM for exercise prescriptions  Reviews F.I.T.T. principles of resistance exercise including progression. Written material given at graduation.    Education: Exercise & Equipment Safety: - Individual verbal instruction and demonstration of equipment use and safety with use of the equipment. Flowsheet Row Pulmonary Rehab from 12/12/2020 in [facility] Cardiac and Pulmonary Rehab  Date 12/10/20  Educator AS   Instruction Review Code 1- Verbalizes Understanding      Education: Exercise Physiology & General Exercise Guidelines: - Group verbal and written instruction with models to review the exercise physiology of the cardiovascular system and associated critical values. Provides general exercise guidelines with specific guidelines to those with heart or lung disease.    Education: Flexibility, Balance, Mind/Body Relaxation: - Group verbal and visual presentation with interactive activity on the components of exercise prescription. Introduces F.I.T.T principle from ACSM for exercise prescriptions. Reviews F.I.T.T. principles of flexibility and balance exercise training including progression. Also discusses the mind body connection.  Reviews various relaxation techniques to help reduce and manage stress (i.e. Deep breathing, progressive muscle relaxation, and visualization). Balance handout provided  to take home. Written material given at graduation.   Activity Barriers & Risk Stratification:   6 Minute Walk:  6 Minute Walk    Row Name 12/10/20 1552         6 Minute Walk   Phase Initial     Distance 400 feet     Walk Time 5.25 minutes     # of Rest Breaks 1     MPH 0.86     METS 0.94     RPE 11     Perceived Dyspnea  2     VO2 Peak 3.3           Oxygen Initial Assessment:  Oxygen Initial Assessment - 12/03/20 1408      Home Oxygen   Home Oxygen Device Home Concentrator;E-Tanks;Portable Concentrator    Sleep Oxygen Prescription Continuous    Liters per minute 4    Home Exercise Oxygen Prescription Continuous    Liters per minute 4    Home Resting Oxygen Prescription Continuous    Liters per minute 4    Compliance with Home Oxygen Use Yes      Initial 6 min Walk   Oxygen Used Continuous    Liters per minute 4      Program Oxygen Prescription   Program Oxygen Prescription Continuous    Liters per minute 4      Intervention   Short Term Goals To learn and exhibit  compliance with exercise, home and travel O2 prescription;To learn and understand importance of monitoring SPO2 with pulse oximeter and demonstrate accurate use of the pulse oximeter.;To learn and understand importance of maintaining oxygen saturations>88%;To learn and demonstrate proper pursed lip breathing techniques or other breathing techniques.;To learn and demonstrate proper use of respiratory medications    Long  Term Goals Exhibits compliance with exercise, home and travel O2 prescription;Verbalizes importance of monitoring SPO2 with pulse oximeter and return demonstration;Maintenance of O2 saturations>88%;Exhibits proper breathing techniques, such as pursed lip breathing or other method taught during program session;Compliance with respiratory medication;Demonstrates proper use of MDI's           Oxygen Re-Evaluation:  Oxygen Re-Evaluation    Row Name 12/12/20 1348             Program Oxygen Prescription   Program Oxygen Prescription Continuous       Liters per minute 4               Home Oxygen   Home Oxygen Device Home Concentrator;E-Tanks;Portable Concentrator       Sleep Oxygen Prescription Continuous       Liters per minute 4       Home Exercise Oxygen Prescription Continuous       Liters per minute 4       Home Resting Oxygen Prescription Continuous       Liters per minute 4       Compliance with Home Oxygen Use Yes               Goals/Expected Outcomes   Short Term Goals To learn and exhibit compliance with exercise, home and travel O2 prescription;To learn and understand importance of monitoring SPO2 with pulse oximeter and demonstrate accurate use of the pulse oximeter.;To learn and understand importance of maintaining oxygen saturations>88%;To learn and demonstrate proper pursed lip breathing techniques or other breathing techniques.       Long  Term Goals Exhibits compliance with exercise, home and travel O2 prescription;Verbalizes importance of monitoring  SPO2  with pulse oximeter and return demonstration;Maintenance of O2 saturations>88%;Exhibits proper breathing techniques, such as pursed lip breathing or other method taught during program session       Comments Reviewed PLB technique with pt.  Talked about how it works and it's importance in maintaining their exercise saturations.       Goals/Expected Outcomes Short: Become more profiecient at using PLB.   Long: Become independent at using PLB.              Oxygen Discharge (Final Oxygen Re-Evaluation):  Oxygen Re-Evaluation - 12/12/20 1348      Program Oxygen Prescription   Program Oxygen Prescription Continuous    Liters per minute 4      Home Oxygen   Home Oxygen Device Home Concentrator;E-Tanks;Portable Concentrator    Sleep Oxygen Prescription Continuous    Liters per minute 4    Home Exercise Oxygen Prescription Continuous    Liters per minute 4    Home Resting Oxygen Prescription Continuous    Liters per minute 4    Compliance with Home Oxygen Use Yes      Goals/Expected Outcomes   Short Term Goals To learn and exhibit compliance with exercise, home and travel O2 prescription;To learn and understand importance of monitoring SPO2 with pulse oximeter and demonstrate accurate use of the pulse oximeter.;To learn and understand importance of maintaining oxygen saturations>88%;To learn and demonstrate proper pursed lip breathing techniques or other breathing techniques.    Long  Term Goals Exhibits compliance with exercise, home and travel O2 prescription;Verbalizes importance of monitoring SPO2 with pulse oximeter and return demonstration;Maintenance of O2 saturations>88%;Exhibits proper breathing techniques, such as pursed lip breathing or other method taught during program session    Comments Reviewed PLB technique with pt.  Talked about how it works and it's importance in maintaining their exercise saturations.    Goals/Expected Outcomes Short: Become more profiecient at using  PLB.   Long: Become independent at using PLB.           Initial Exercise Prescription:  Initial Exercise Prescription - 12/10/20 1500      Date of Initial Exercise RX and Referring Provider   Date 12/10/20    Referring Provider Aleskerov      Oxygen   Oxygen Continuous    Liters 2-4      Treadmill   MPH 0.5    Grade 0    Minutes 15    METs 0.94      Recumbant Bike   Level 1    RPM 60    Minutes 15    METs 1      NuStep   Level 1    SPM 80    Minutes 15    METs 1      REL-XR   Level 1    Speed 50    Minutes 15    METs 1      Biostep-RELP   Level 1    SPM 50    Minutes 15    METs 1      Prescription Details   Frequency (times per week) 3    Duration Progress to 30 minutes of continuous aerobic without signs/symptoms of physical distress      Intensity   THRR 40-80% of Max Heartrate 99-127    Ratings of Perceived Exertion 11-15    Perceived Dyspnea 0-4      Resistance Training   Training Prescription Yes    Weight 2 lb    Reps  10-15           Perform Capillary Blood Glucose checks as needed.  Exercise Prescription Changes:  Exercise Prescription Changes    Row Name 12/10/20 1500 12/13/20 0900 12/25/20 1200         Response to Exercise   Blood Pressure (Admit) 130/64 120/80 116/66     Blood Pressure (Exercise) 116/66 126/62 146/72     Blood Pressure (Exit) -- 120/82 100/60     Heart Rate (Admit) 73 bpm 73 bpm 70 bpm     Heart Rate (Exercise) 86 bpm 89 bpm 96 bpm     Heart Rate (Exit) 78 bpm 74 bpm 97 bpm     Oxygen Saturation (Admit) 96 % 99 % 98 %     Oxygen Saturation (Exercise) 90 % 99 % 98 %     Oxygen Saturation (Exit) 92 % 100 % 99 %     Rating of Perceived Exertion (Exercise) 11 12 15      Perceived Dyspnea (Exercise) 2 2 2      Symptoms -- none none     Duration -- -- Continue with 30 min of aerobic exercise without signs/symptoms of physical distress.     Intensity -- -- THRR unchanged           Progression   Progression --  -- Continue to progress workloads to maintain intensity without signs/symptoms of physical distress.     Average METs -- -- 1.81           Resistance Training   Training Prescription -- -- Yes     Weight -- -- 2 lb     Reps -- -- 10-15           Interval Training   Interval Training -- -- No           Treadmill   MPH -- -- 0.7     Grade -- -- 0     Minutes -- -- 15     METs -- -- 1.54           NuStep   Level -- -- 2     Minutes -- -- 15     METs -- -- 1.8           REL-XR   Level -- -- 1     Minutes -- -- 15     METs -- -- 2.1            Exercise Comments:  Exercise Comments    Row Name 12/12/20 1347           Exercise Comments First full day of exercise!  Patient was oriented to gym and equipment including functions, settings, policies, and procedures.  Patient's individual exercise prescription and treatment plan were reviewed.  All starting workloads were established based on the results of the 6 minute walk test done at initial orientation visit.  The plan for exercise progression was also introduced and progression will be customized based on patient's performance and goals.              Exercise Goals and Review:  Exercise Goals    Row Name 12/10/20 1556             Exercise Goals   Increase Physical Activity Yes       Intervention Provide advice, education, support and counseling about physical activity/exercise needs.;Develop an individualized exercise prescription for aerobic and resistive training based on initial evaluation findings, risk stratification, comorbidities and participant's  personal goals.       Expected Outcomes Short Term: Attend rehab on a regular basis to increase amount of physical activity.;Long Term: Add in home exercise to make exercise part of routine and to increase amount of physical activity.;Long Term: Exercising regularly at least 3-5 days a week.       Increase Strength and Stamina Yes       Intervention Provide  advice, education, support and counseling about physical activity/exercise needs.;Develop an individualized exercise prescription for aerobic and resistive training based on initial evaluation findings, risk stratification, comorbidities and participant's personal goals.       Expected Outcomes Short Term: Increase workloads from initial exercise prescription for resistance, speed, and METs.;Short Term: Perform resistance training exercises routinely during rehab and add in resistance training at home;Long Term: Improve cardiorespiratory fitness, muscular endurance and strength as measured by increased METs and functional capacity ( )       Able to understand and use rate of perceived exertion (RPE) scale Yes       Intervention Provide education and explanation on how to use RPE scale       Expected Outcomes Short Term: Able to use RPE daily in rehab to express subjective intensity level;Long Term:  Able to use RPE to guide intensity level when exercising independently       Able to understand and use Dyspnea scale Yes       Intervention Provide education and explanation on how to use Dyspnea scale       Expected Outcomes Short Term: Able to use Dyspnea scale daily in rehab to express subjective sense of shortness of breath during exertion;Long Term: Able to use Dyspnea scale to guide intensity level when exercising independently       Knowledge and understanding of Target Heart Rate Range (THRR) Yes       Intervention Provide education and explanation of THRR including how the numbers were predicted and where they are located for reference       Expected Outcomes Short Term: Able to state/look up THRR;Short Term: Able to use daily as guideline for intensity in rehab;Long Term: Able to use THRR to govern intensity when exercising independently       Able to check pulse independently Yes       Intervention Provide education and demonstration on how to check pulse in carotid and radial arteries.;Review  the importance of being able to check your own pulse for safety during independent exercise       Expected Outcomes Short Term: Able to explain why pulse checking is important during independent exercise;Long Term: Able to check pulse independently and accurately       Understanding of Exercise Prescription Yes       Intervention Provide education, explanation, and written materials on patient's individual exercise prescription       Expected Outcomes Short Term: Able to explain program exercise prescription;Long Term: Able to explain home exercise prescription to exercise independently              Exercise Goals Re-Evaluation :  Exercise Goals Re-Evaluation    Row Name 12/12/20 1347 12/25/20 1253           Exercise Goal Re-Evaluation   Exercise Goals Review Increase Physical Activity;Able to understand and use rate of perceived exertion (RPE) scale;Knowledge and understanding of Target Heart Rate Range (THRR);Understanding of Exercise Prescription;Increase Strength and Stamina;Able to understand and use Dyspnea scale;Able to check pulse independently Increase Physical Activity;Increase Strength and Stamina;Understanding of  Exercise Prescription      Comments Reviewed RPE and dyspnea scales, THR and program prescription with pt today.  Pt voiced understanding and was given a copy of goals to take home. Shyniece is doing well in rehab.  She has completed her first five days of exercise so far.  She is now up 0.7 mph on the treadmill for her full 15 min!!  We will continue to monitor her progress.      Expected Outcomes Short: Use RPE daily to regulate intensity. Long: Follow program prescription in THR. Short: Continue to move up seated equipment Long: Continue to attend regulary and follow program prescription             Discharge Exercise Prescription (Final Exercise Prescription Changes):  Exercise Prescription Changes - 12/25/20 1200      Response to Exercise   Blood Pressure (Admit)  116/66    Blood Pressure (Exercise) 146/72    Blood Pressure (Exit) 100/60    Heart Rate (Admit) 70 bpm    Heart Rate (Exercise) 96 bpm    Heart Rate (Exit) 97 bpm    Oxygen Saturation (Admit) 98 %    Oxygen Saturation (Exercise) 98 %    Oxygen Saturation (Exit) 99 %    Rating of Perceived Exertion (Exercise) 15    Perceived Dyspnea (Exercise) 2    Symptoms none    Duration Continue with 30 min of aerobic exercise without signs/symptoms of physical distress.    Intensity THRR unchanged      Progression   Progression Continue to progress workloads to maintain intensity without signs/symptoms of physical distress.    Average METs 1.81      Resistance Training   Training Prescription Yes    Weight 2 lb    Reps 10-15      Interval Training   Interval Training No      Treadmill   MPH 0.7    Grade 0    Minutes 15    METs 1.54      NuStep   Level 2    Minutes 15    METs 1.8      REL-XR   Level 1    Minutes 15    METs 2.1           Nutrition:  Target Goals: Understanding of nutrition guidelines, daily intake of sodium 1500mg , cholesterol 200mg , calories 30% from fat and 7% or less from saturated fats, daily to have 5 or more servings of fruits and vegetables.  Education: All About Nutrition: -Group instruction provided by verbal, written material, interactive activities, discussions, models, and posters to present general guidelines for heart healthy nutrition including fat, fiber, MyPlate, the role of sodium in heart healthy nutrition, utilization of the nutrition label, and utilization of this knowledge for meal planning. Follow up email sent as well. Written material given at graduation.   Biometrics:  Pre Biometrics - 12/10/20 1556      Pre Biometrics   Height  (1.6 m)    Weight 114 lb 9.6 oz (52 kg)    BMI (Calculated) 20.31    Single Leg Stand 6.09 seconds            Nutrition Therapy Plan and Nutrition Goals:   Nutrition  Assessments:  MEDIFICTS Score Key:  ?70 Need to make dietary changes   40-70 Heart Healthy Diet  ? 40 Therapeutic Level Cholesterol Diet  Flowsheet Row Pulmonary Rehab from 12/10/2020 in [facility] Cardiac and Pulmonary Rehab  Picture Your Plate Total Score on Admission 64     Picture Your Plate Scores:  <16 Unhealthy dietary pattern with much room for improvement.  41-50 Dietary pattern unlikely to meet recommendations for good health and room for improvement.  51-60 More healthful dietary pattern, with some room for improvement.   >60 Healthy dietary pattern, although there may be some specific behaviors that could be improved.   Nutrition Goals Re-Evaluation:   Nutrition Goals Discharge (Final Nutrition Goals Re-Evaluation):   Psychosocial: Target Goals: Acknowledge presence or absence of significant depression and/or stress, maximize coping skills, provide positive support system. Participant is able to verbalize types and ability to use techniques and skills needed for reducing stress and depression.   Education: Stress, Anxiety, and Depression - Group verbal and visual presentation to define topics covered.  Reviews how body is impacted by stress, anxiety, and depression.  Also discusses healthy ways to reduce stress and to treat/manage anxiety and depression.  Written material given at graduation.   Education: Sleep Hygiene -Provides group verbal and written instruction about how sleep can affect your health.  Define sleep hygiene, discuss sleep cycles and impact of sleep habits. Review good sleep hygiene tips.    Initial Review & Psychosocial Screening:  Initial Psych Review & Screening - 12/03/20 1414      Initial Review   Current issues with Current Psychotropic Meds;Current Sleep Concerns      Family Dynamics   Good Support System? Yes    Comments She misses her house in [place] and is now staying with her daughter. She can look to her daughter and son in-law for  support. Her husband passed away in 02-05-10. She has a positvie outlook on her health.      Barriers   Psychosocial barriers to participate in program The patient should benefit from training in stress management and relaxation.      Screening Interventions   Interventions To provide support and resources with identified psychosocial needs;Provide feedback about the scores to participant;Encouraged to exercise    Expected Outcomes Short Term goal: Utilizing psychosocial counselor, staff and physician to assist with identification of specific Stressors or current issues interfering with healing process. Setting desired goal for each stressor or current issue identified.;Long Term Goal: Stressors or current issues are controlled or eliminated.;Short Term goal: Identification and review with participant of any Quality of Life or Depression concerns found by scoring the questionnaire.;Long Term goal: The participant improves quality of Life and PHQ9 Scores as seen by post scores and/or verbalization of changes           Quality of Life Scores:  Scores of 19 and below usually indicate a poorer quality of life in these areas.  A difference of  2-3 points is a clinically meaningful difference.  A difference of 2-3 points in the total score of the Quality of Life Index has been associated with significant improvement in overall quality of life, self-image, physical symptoms, and general health in studies assessing change in quality of life.  PHQ-9: Recent Review Flowsheet Data    Depression screen [facility] 2/9 12/10/2020   Decreased Interest 0   Down, Depressed, Hopeless 0   PHQ - 2 Score 0   Altered sleeping 3    Tired, decreased energy 1   Change in appetite 0   Feeling bad or failure about yourself  0   Trouble concentrating 2   Moving slowly or fidgety/restless 0   Suicidal thoughts 0   PHQ-9 Score  6   Difficult doing work/chores Not difficult at all     Interpretation of Total Score  Total  Score Depression Severity:  1-4 = Minimal depression, 5-9 = Mild depression, 10-14 = Moderate depression, 15-19 = Moderately severe depression, 20-27 = Severe depression   Psychosocial Evaluation and Intervention:  Psychosocial Evaluation - 12/03/20 1418      Psychosocial Evaluation & Interventions   Interventions Encouraged to exercise with the program and follow exercise prescription;Relaxation education;Stress management education    Comments She misses her house in [place] and is now staying with her daughter. She can look to her daughter and son in-law for support. Her husband passed away in 2010-01-17. She has a positvie outlook on her health.    Expected Outcomes Short: Exercise regularly to support mental health and notify staff of any changes. Long: maintain mental health and well being through teaching of rehab or prescribed medications independently.    Continue Psychosocial Services  Follow up required by staff           Psychosocial Re-Evaluation:   Psychosocial Discharge (Final Psychosocial Re-Evaluation):   Education: Education Goals: Education classes will be provided on a weekly basis, covering required topics. Participant will state understanding/return demonstration of topics presented.  Learning Barriers/Preferences:  Learning Barriers/Preferences - 12/03/20 1411      Learning Barriers/Preferences   Learning Barriers None    Learning Preferences None           General Pulmonary Education Topics:  Infection Prevention: - Provides verbal and written material to individual with discussion of infection control including proper hand washing and proper equipment cleaning during exercise session. Flowsheet Row Pulmonary Rehab from 12/12/2020 in [facility] Cardiac and Pulmonary Rehab  Date 12/10/20  Educator AS  Instruction Review Code 1- Verbalizes Understanding      Falls Prevention: - Provides verbal and written material to individual with discussion of falls prevention  and safety. Flowsheet Row Pulmonary Rehab from 12/12/2020 in [facility] Cardiac and Pulmonary Rehab  Date 12/10/20  Educator AS  Instruction Review Code 1- Verbalizes Understanding      Chronic Lung Disease Review: - Group verbal instruction with posters, models, PowerPoint presentations and videos,  to review new updates, new respiratory medications, new advancements in procedures and treatments. Providing information on websites and "800" numbers for continued self-education. Includes information about supplement oxygen, available portable oxygen systems, continuous and intermittent flow rates, oxygen safety, concentrators, and Medicare reimbursement for oxygen. Explanation of Pulmonary Drugs, including class, frequency, complications, importance of spacers, rinsing mouth after steroid MDI's, and proper cleaning methods for nebulizers. Review of basic lung anatomy and physiology related to function, structure, and complications of lung disease. Review of risk factors. Discussion about methods for diagnosing sleep apnea and types of masks and machines for OSA. Includes a review of the use of types of environmental controls: home humidity, furnaces, filters, dust mite/pet prevention, HEPA vacuums. Discussion about weather changes, air quality and the benefits of nasal washing. Instruction on Warning signs, infection symptoms, calling MD promptly, preventive modes, and value of vaccinations. Review of effective airway clearance, coughing and/or vibration techniques. Emphasizing that all should Create an Action Plan. Written material given at graduation. Flowsheet Row Pulmonary Rehab from 12/12/2020 in [facility] Cardiac and Pulmonary Rehab  Date 12/12/20  Educator [facility]  Instruction Review Code 1- Verbalizes Understanding      AED/CPR: - Group verbal and written instruction with the use of models to demonstrate the basic use of the AED with the basic  ABC's of resuscitation.    Anatomy and Cardiac Procedures: -  Group verbal and visual presentation and models provide information about basic cardiac anatomy and function. Reviews the testing methods done to diagnose heart disease and the outcomes of the test results. Describes the treatment choices: Medical Management, Angioplasty, or Coronary Bypass Surgery for treating various heart conditions including Myocardial Infarction, Angina, Valve Disease, and Cardiac Arrhythmias.  Written material given at graduation.   Medication Safety: - Group verbal and visual instruction to review commonly prescribed medications for heart and lung disease. Reviews the medication, class of the drug, and side effects. Includes the steps to properly store meds and maintain the prescription regimen.  Written material given at graduation.   Other: -Provides group and verbal instruction on various topics (see comments)   Knowledge Questionnaire Score:  Knowledge Questionnaire Score - 12/10/20 1559      Knowledge Questionnaire Score   Pre Score 9/18            Core Components/Risk Factors/Patient Goals at Admission:  Personal Goals and Risk Factors at Admission - 12/10/20 1602      Core Components/Risk Factors/Patient Goals on Admission    Weight Management Yes;Weight Maintenance    Intervention Weight Management: Develop a combined nutrition and exercise program designed to reach desired caloric intake, while maintaining appropriate intake of nutrient and fiber, sodium and fats, and appropriate energy expenditure required for the weight goal.;Weight Management: Provide education and appropriate resources to help participant work on and attain dietary goals.;Weight Management/Obesity: Establish reasonable short term and long term weight goals.    Expected Outcomes Short Term: Continue to assess and modify interventions until short term weight is achieved;Long Term: Adherence to nutrition and physical activity/exercise program aimed toward attainment of established weight  goal;Weight Maintenance: Understanding of the daily nutrition guidelines, which includes 25-35% calories from fat, 7% or less cal from saturated fats, less than 200mg  cholesterol, less than 1.5gm of sodium, & 5 or more servings of fruits and vegetables daily;Understanding recommendations for meals to include 15-35% energy as protein, 25-35% energy from fat, 35-60% energy from carbohydrates, less than 200mg  of dietary cholesterol, 20-35 gm of total fiber daily;Understanding of distribution of calorie intake throughout the day with the consumption of 4-5 meals/snacks    Improve shortness of breath with ADL's Yes    Intervention Provide education, individualized exercise plan and daily activity instruction to help decrease symptoms of SOB with activities of daily living.    Expected Outcomes Short Term: Improve cardiorespiratory fitness to achieve a reduction of symptoms when performing ADLs;Long Term: Be able to perform more ADLs without symptoms or delay the onset of symptoms           Education:Diabetes - Individual verbal and written instruction to review signs/symptoms of diabetes, desired ranges of glucose level fasting, after meals and with exercise. Acknowledge that pre and post exercise glucose checks will be done for 3 sessions at entry of program.   Know Your Numbers and Heart Failure: - Group verbal and visual instruction to discuss disease risk factors for cardiac and pulmonary disease and treatment options.  Reviews associated critical values for Overweight/Obesity, Hypertension, Cholesterol, and Diabetes.  Discusses basics of heart failure: signs/symptoms and treatments.  Introduces Heart Failure Zone chart for action plan for heart failure.  Written material given at graduation.   Core Components/Risk Factors/Patient Goals Review:    Core Components/Risk Factors/Patient Goals at Discharge (Final Review):    ITP Comments:  ITP Comments  Row Name 12/03/20 1410 12/03/20 1419  12/10/20 1604 12/12/20 1346 01/02/21 0803   ITP Comments Virtual Visit completed. Patient informed on EP and RD appointment and 6 Minute walk test. Patient also informed of patient health questionnaires on My Chart. Patient Verbalizes understanding. Visit diagnosis can be found in [facility] 11/07/2020. Patient will bring her medication list when she comes with her daughter. Completed and gym orientation. Initial ITP created and sent for review to Dr. Bethann Punches, Medical Director. First full day of exercise!  Patient was oriented to gym and equipment including functions, settings, policies, and procedures.  Patient's individual exercise prescription and treatment plan were reviewed.  All starting workloads were established based on the results of the 6 minute walk test done at initial orientation visit.  The plan for exercise progression was also introduced and progression will be customized based on patient's performance and goals. 30 Day review completed. Medical Director ITP review done, changes made as directed, and signed approval by Medical Director.          Comments:

## 2021-01-02 NOTE — Progress Notes (Signed)
Daily Session Note  Patient Details  Name: Aishah Teffeteller MRN: [id number] Date of Birth: 1940-12-15 Referring Provider:   Flowsheet Row Pulmonary Rehab from 12/10/2020 in [facility] Cardiac and Pulmonary Rehab  Referring Provider Lanney Gins      Encounter Date: 01/02/2021  Check In:  Session Check In - 01/02/21 1350      Check-In   Supervising physician immediately available to respond to emergencies See telemetry face sheet for immediately available ER MD    Location [facility]-Cardiac & Pulmonary Rehab    Staff Present Renita Papa, RN BSN;Joseph Lou Miner, [place] Exercise Physiologist    Virtual Visit No    Medication changes reported     No    Fall or balance concerns reported    No    Warm-up and Cool-down Performed on first and last piece of equipment    Resistance Training Performed Yes    VAD Patient? No    PAD/SET Patient? No      Pain Assessment   Currently in Pain? No/denies              Social History   Tobacco Use  Smoking Status Former Smoker  . Packs/day: 1.00  . Years: 45.00  . Pack years: 45.00  . Types: Cigarettes  . Quit date: 12/04/2007  . Years since quitting: 13.0  Smokeless Tobacco Never Used    Goals Met:  Independence with exercise equipment Exercise tolerated well No report of cardiac concerns or symptoms Strength training completed today  Goals Unmet:  Not Applicable  Comments: Pt able to follow exercise prescription today without complaint.  Will continue to monitor for progression.    Dr. Emily Filbert is Medical Director for [place] and [facility].

## 2021-01-02 NOTE — Progress Notes (Signed)
Completed initial RD consultation ?

## 2021-01-03 ENCOUNTER — Encounter: Payer: Medicare Other | Admitting: *Deleted

## 2021-01-03 ENCOUNTER — Other Ambulatory Visit: Payer: Self-pay

## 2021-01-03 DIAGNOSIS — J439 Emphysema, unspecified: Secondary | ICD-10-CM

## 2021-01-03 NOTE — Progress Notes (Signed)
Daily Session Note  Patient Details  Name: Foster Brueggemann MRN: [id number] Date of Birth: 01/06/1941 Referring Provider:   Flowsheet Row Pulmonary Rehab from 12/10/2020 in [facility] Cardiac and Pulmonary Rehab  Referring Provider Aleskerov      Encounter Date: 01/03/2021  Check In:  Session Check In - 01/03/21 1346      Check-In   Supervising physician immediately available to respond to emergencies See telemetry face sheet for immediately available ER MD    Location [facility]-Cardiac & Pulmonary Rehab    Staff Present Meredith Craven, RN BSN;Joseph Hood RCP,RRT,BSRT;Melissa Caiola RDN, LDN    Virtual Visit No    Medication changes reported     No    Fall or balance concerns reported    No    Warm-up and Cool-down Performed on first and last piece of equipment    Resistance Training Performed Yes    VAD Patient? No    PAD/SET Patient? No      Pain Assessment   Currently in Pain? No/denies              Social History   Tobacco Use  Smoking Status Former Smoker  . Packs/day: 1.00  . Years: 45.00  . Pack years: 45.00  . Types: Cigarettes  . Quit date: 12/04/2007  . Years since quitting: 13.0  Smokeless Tobacco Never Used    Goals Met:  Independence with exercise equipment Exercise tolerated well No report of cardiac concerns or symptoms Strength training completed today  Goals Unmet:  Not Applicable  Comments: Pt able to follow exercise prescription today without complaint.  Will continue to monitor for progression.    Dr. Mark Miller is Medical Director for [facility] and [facility]. 

## 2021-01-07 ENCOUNTER — Other Ambulatory Visit: Payer: Self-pay

## 2021-01-07 DIAGNOSIS — J439 Emphysema, unspecified: Secondary | ICD-10-CM | POA: Diagnosis not present

## 2021-01-07 NOTE — Progress Notes (Signed)
Daily Session Note  Patient Details  Name: Brittnae Aschenbrenner MRN: [id number] Date of Birth: Dec 06, 1940 Referring Provider:   Flowsheet Row Pulmonary Rehab from 12/10/2020 in [facility] Cardiac and Pulmonary Rehab  Referring Provider Lanney Gins      Encounter Date: 01/07/2021  Check In:  Session Check In - 01/07/21 1339      Check-In   Supervising physician immediately available to respond to emergencies See telemetry face sheet for immediately available ER MD    Location [facility]-Cardiac & Pulmonary Rehab    Staff Present Birdie Sons, MPA, Mauricia Area, BS, ACSM CEP, Exercise Physiologist;Kara Eliezer Bottom, MS Exercise Physiologist    Virtual Visit No    Medication changes reported     No    Fall or balance concerns reported    No    Warm-up and Cool-down Performed on first and last piece of equipment    Resistance Training Performed Yes    VAD Patient? No    PAD/SET Patient? No      Pain Assessment   Currently in Pain? No/denies              Social History   Tobacco Use  Smoking Status Former Smoker  . Packs/day: 1.00  . Years: 45.00  . Pack years: 45.00  . Types: Cigarettes  . Quit date: 12/04/2007  . Years since quitting: 13.1  Smokeless Tobacco Never Used    Goals Met:  Independence with exercise equipment Exercise tolerated well No report of cardiac concerns or symptoms Strength training completed today  Goals Unmet:  Not Applicable  Comments: Pt able to follow exercise prescription today without complaint.  Will continue to monitor for progression.    Dr. Emily Filbert is Medical Director for [place] and [facility].

## 2021-01-09 ENCOUNTER — Other Ambulatory Visit: Payer: Self-pay

## 2021-01-09 DIAGNOSIS — J439 Emphysema, unspecified: Secondary | ICD-10-CM

## 2021-01-09 NOTE — Progress Notes (Signed)
Daily Session Note  Patient Details  Name: Danielle Sandoval MRN: [id number] Date of Birth: [birth year] Referring Provider:   Flowsheet Row Pulmonary Rehab from 12/10/2020 in [facility] Cardiac and Pulmonary Rehab  Referring Provider Lanney Gins      Encounter Date: 01/09/2021  Check In:  Session Check In - 01/09/21 1333      Check-In   Supervising physician immediately available to respond to emergencies See telemetry face sheet for immediately available ER MD    Location [facility]-Cardiac & Pulmonary Rehab    Staff Present Birdie Sons, MPA, Nino Glow, MS Exercise Physiologist;Joseph Flavia Shipper    Virtual Visit No    Medication changes reported     No    Fall or balance concerns reported    No    Warm-up and Cool-down Performed on first and last piece of equipment    Resistance Training Performed Yes    VAD Patient? No    PAD/SET Patient? No      Pain Assessment   Currently in Pain? No/denies              Social History   Tobacco Use  Smoking Status Former Smoker  . Packs/day: 1.00  . Years: 45.00  . Pack years: 45.00  . Types: Cigarettes  . Quit date: 12/04/2007  . Years since quitting: 13.1  Smokeless Tobacco Never Used    Goals Met:  Independence with exercise equipment Exercise tolerated well No report of cardiac concerns or symptoms Strength training completed today  Goals Unmet:  Not Applicable  Comments: Pt able to follow exercise prescription today without complaint.  Will continue to monitor for progression.    Dr. Emily Filbert is Medical Director for [place] and [facility].

## 2021-01-10 ENCOUNTER — Other Ambulatory Visit: Payer: Self-pay

## 2021-01-10 ENCOUNTER — Encounter: Payer: Medicare Other | Admitting: *Deleted

## 2021-01-10 DIAGNOSIS — J439 Emphysema, unspecified: Secondary | ICD-10-CM | POA: Diagnosis not present

## 2021-01-10 NOTE — Progress Notes (Signed)
Daily Session Note  Patient Details  Name: Danielle Sandoval MRN: [id number] Date of Birth: 1941/01/24 Referring Provider:   Flowsheet Row Pulmonary Rehab from 12/10/2020 in [facility] Cardiac and Pulmonary Rehab  Referring Provider Lanney Gins      Encounter Date: 01/10/2021  Check In:  Session Check In - 01/10/21 1325      Check-In   Supervising physician immediately available to respond to emergencies See telemetry face sheet for immediately available ER MD    Location [facility]-Cardiac & Pulmonary Rehab    Staff Present Renita Papa, RN BSN;Joseph [address] [place], [place], [place], CCRP, CCET    Virtual Visit No    Medication changes reported     No    Fall or balance concerns reported    No    Warm-up and Cool-down Performed on first and last piece of equipment    Resistance Training Performed Yes    VAD Patient? No    PAD/SET Patient? No      Pain Assessment   Currently in Pain? No/denies              Social History   Tobacco Use  Smoking Status Former Smoker  . Packs/day: 1.00  . Years: 45.00  . Pack years: 45.00  . Types: Cigarettes  . Quit date: 12/04/2007  . Years since quitting: 13.1  Smokeless Tobacco Never Used    Goals Met:  Independence with exercise equipment Exercise tolerated well No report of cardiac concerns or symptoms Strength training completed today  Goals Unmet:  Not Applicable  Comments: Pt able to follow exercise prescription today without complaint.  Will continue to monitor for progression. Reviewed home exercise with pt today.  Pt plans to walk and consider [employer] for exercise.  Reviewed THR, pulse, RPE, sign and symptoms, pulse oximetery and when to call 911 or MD.  Also discussed weather considerations and indoor options.  Pt voiced understanding.    Dr. Emily Filbert is Medical Director for [place] and [facility].

## 2021-01-14 ENCOUNTER — Other Ambulatory Visit: Payer: Self-pay

## 2021-01-14 ENCOUNTER — Encounter: Payer: Medicare Other | Attending: Pulmonary Disease

## 2021-01-14 DIAGNOSIS — J439 Emphysema, unspecified: Secondary | ICD-10-CM | POA: Diagnosis not present

## 2021-01-14 NOTE — Progress Notes (Signed)
Daily Session Note  Patient Details  Name: Danielle Sandoval MRN: [id number] Date of Birth: [birth year] Referring Provider:   Flowsheet Row Pulmonary Rehab from 12/10/2020 in [facility] Cardiac and Pulmonary Rehab  Referring Provider Lanney Gins      Encounter Date: 01/14/2021  Check In:  Session Check In - 01/14/21 1342      Check-In   Supervising physician immediately available to respond to emergencies See telemetry face sheet for immediately available ER MD    Location [facility]-Cardiac & Pulmonary Rehab    Staff Present Birdie Sons, MPA, Mauricia Area, BS, ACSM CEP, Exercise Physiologist;Kara Eliezer Bottom, MS Exercise Physiologist    Virtual Visit No    Medication changes reported     No    Fall or balance concerns reported    No    Warm-up and Cool-down Performed on first and last piece of equipment    Resistance Training Performed Yes    VAD Patient? No    PAD/SET Patient? No      Pain Assessment   Currently in Pain? No/denies              Social History   Tobacco Use  Smoking Status Former Smoker  . Packs/day: 1.00  . Years: 45.00  . Pack years: 45.00  . Types: Cigarettes  . Quit date: 12/04/2007  . Years since quitting: 13.1  Smokeless Tobacco Never Used    Goals Met:  Independence with exercise equipment Exercise tolerated well No report of cardiac concerns or symptoms Strength training completed today  Goals Unmet:  Not Applicable  Comments: Pt able to follow exercise prescription today without complaint.  Will continue to monitor for progression.    Dr. Emily Filbert is Medical Director for [place] and [facility].

## 2021-01-16 ENCOUNTER — Other Ambulatory Visit: Payer: Self-pay

## 2021-01-16 DIAGNOSIS — J439 Emphysema, unspecified: Secondary | ICD-10-CM

## 2021-01-16 NOTE — Progress Notes (Signed)
Daily Session Note  Patient Details  Name: Danielle Sandoval MRN: [id number] Date of Birth: 07/07/1941 Referring Provider:   Flowsheet Row Pulmonary Rehab from 12/10/2020 in [facility] Cardiac and Pulmonary Rehab  Referring Provider Lanney Gins      Encounter Date: 01/16/2021  Check In:  Session Check In - 01/16/21 1357      Check-In   Supervising physician immediately available to respond to emergencies See telemetry face sheet for immediately available ER MD    Location [facility]-Cardiac & Pulmonary Rehab    Staff Present Birdie Sons, MPA, RN;Joseph Lou Miner, [place] Exercise Physiologist    Virtual Visit No    Medication changes reported     No    Fall or balance concerns reported    No    Warm-up and Cool-down Performed on first and last piece of equipment    Resistance Training Performed Yes    VAD Patient? No    PAD/SET Patient? No      Pain Assessment   Currently in Pain? No/denies              Social History   Tobacco Use  Smoking Status Former Smoker  . Packs/day: 1.00  . Years: 45.00  . Pack years: 45.00  . Types: Cigarettes  . Quit date: 12/04/2007  . Years since quitting: 13.1  Smokeless Tobacco Never Used    Goals Met:  Independence with exercise equipment Exercise tolerated well No report of cardiac concerns or symptoms Strength training completed today  Goals Unmet:  Not Applicable  Comments: Pt able to follow exercise prescription today without complaint.  Will continue to monitor for progression.    Dr. Emily Filbert is Medical Director for [name] and [facility].

## 2021-01-21 ENCOUNTER — Other Ambulatory Visit: Payer: Self-pay

## 2021-01-21 DIAGNOSIS — J439 Emphysema, unspecified: Secondary | ICD-10-CM | POA: Diagnosis not present

## 2021-01-21 NOTE — Progress Notes (Signed)
Daily Session Note  Patient Details  Name: Danielle Sandoval MRN: [id number] Date of Birth: March 06, 1941 Referring Provider:   Flowsheet Row Pulmonary Rehab from 12/10/2020 in [facility] Cardiac and Pulmonary Rehab  Referring Provider Lanney Gins      Encounter Date: 01/21/2021  Check In:  Session Check In - 01/21/21 1330      Check-In   Supervising physician immediately available to respond to emergencies See telemetry face sheet for immediately available ER MD    Location [facility]-Cardiac & Pulmonary Rehab    Staff Present Birdie Sons, MPA, Mauricia Area, BS, ACSM CEP, Exercise Physiologist;Kara Eliezer Bottom, MS Exercise Physiologist    Virtual Visit No    Medication changes reported     No    Fall or balance concerns reported    No    Warm-up and Cool-down Performed on first and last piece of equipment    Resistance Training Performed Yes    VAD Patient? No    PAD/SET Patient? No      Pain Assessment   Currently in Pain? No/denies              Social History   Tobacco Use  Smoking Status Former Smoker  . Packs/day: 1.00  . Years: 45.00  . Pack years: 45.00  . Types: Cigarettes  . Quit date: 12/04/2007  . Years since quitting: 13.1  Smokeless Tobacco Never Used    Goals Met:  Independence with exercise equipment Exercise tolerated well No report of cardiac concerns or symptoms Strength training completed today  Goals Unmet:  Not Applicable  Comments: Pt able to follow exercise prescription today without complaint.  Will continue to monitor for progression.    Dr. Emily Filbert is Medical Director for [place] and [facility].

## 2021-01-23 ENCOUNTER — Other Ambulatory Visit: Payer: Self-pay

## 2021-01-23 DIAGNOSIS — J439 Emphysema, unspecified: Secondary | ICD-10-CM

## 2021-01-23 NOTE — Progress Notes (Signed)
Daily Session Note  Patient Details  Name: Danielle Sandoval MRN: [id number] Date of Birth: Mar 08, 1941 Referring Provider:   Flowsheet Row Pulmonary Rehab from 12/10/2020 in [facility] Cardiac and Pulmonary Rehab  Referring Provider Lanney Gins      Encounter Date: 01/23/2021  Check In:  Session Check In - 01/23/21 1340      Check-In   Supervising physician immediately available to respond to emergencies See telemetry face sheet for immediately available ER MD    Location [facility]-Cardiac & Pulmonary Rehab    Staff Present Birdie Sons, MPA, RN;Joseph Lou Miner, [place] Exercise Physiologist    Virtual Visit No    Medication changes reported     No    Fall or balance concerns reported    No    Warm-up and Cool-down Performed on first and last piece of equipment    Resistance Training Performed Yes    VAD Patient? No    PAD/SET Patient? No      Pain Assessment   Currently in Pain? No/denies              Social History   Tobacco Use  Smoking Status Former Smoker  . Packs/day: 1.00  . Years: 45.00  . Pack years: 45.00  . Types: Cigarettes  . Quit date: 12/04/2007  . Years since quitting: 13.1  Smokeless Tobacco Never Used    Goals Met:  Independence with exercise equipment Exercise tolerated well No report of cardiac concerns or symptoms Strength training completed today  Goals Unmet:  Not Applicable  Comments: Pt able to follow exercise prescription today without complaint.  Will continue to monitor for progression.    Dr. Emily Filbert is Medical Director for [place] and [facility].

## 2021-01-24 ENCOUNTER — Encounter: Payer: Medicare Other | Admitting: *Deleted

## 2021-01-24 ENCOUNTER — Other Ambulatory Visit: Payer: Self-pay

## 2021-01-24 DIAGNOSIS — J439 Emphysema, unspecified: Secondary | ICD-10-CM | POA: Diagnosis not present

## 2021-01-24 NOTE — Progress Notes (Signed)
Daily Session Note  Patient Details  Name: Danielle Sandoval MRN: [id number] Date of Birth: 01-Feb-1941 Referring Provider:   Flowsheet Row Pulmonary Rehab from 12/10/2020 in [facility] Cardiac and Pulmonary Rehab  Referring Provider Lanney Gins      Encounter Date: 01/24/2021  Check In:  Session Check In - 01/24/21 1409      Check-In   Supervising physician immediately available to respond to emergencies See telemetry face sheet for immediately available ER MD    Location [facility]-Cardiac & Pulmonary Rehab    Staff Present Heath Lark, RN, BSN, CCRP;Jessica Monarch, MA, RCEP, CCRP, CCET;Joseph [place] RCP,RRT,BSRT    Virtual Visit No    Medication changes reported     No    Fall or balance concerns reported    No    Warm-up and Cool-down Performed on first and last piece of equipment    Resistance Training Performed Yes    VAD Patient? No    PAD/SET Patient? No      Pain Assessment   Currently in Pain? No/denies              Social History   Tobacco Use  Smoking Status Former Smoker  . Packs/day: 1.00  . Years: 45.00  . Pack years: 45.00  . Types: Cigarettes  . Quit date: 12/04/2007  . Years since quitting: 13.1  Smokeless Tobacco Never Used    Goals Met:  Proper associated with RPD/PD & O2 Sat Independence with exercise equipment Exercise tolerated well No report of cardiac concerns or symptoms  Goals Unmet:  Not Applicable  Comments: Pt able to follow exercise prescription today without complaint.  Will continue to monitor for progression.    Dr. Emily Filbert is Medical Director for [place] and [facility].

## 2021-01-28 ENCOUNTER — Other Ambulatory Visit: Payer: Self-pay

## 2021-01-28 DIAGNOSIS — J439 Emphysema, unspecified: Secondary | ICD-10-CM | POA: Diagnosis not present

## 2021-01-28 NOTE — Progress Notes (Signed)
Daily Session Note  Patient Details  Name: Danielle Sandoval MRN: [id number] Date of Birth: 05/24/1941 Referring Provider:   Flowsheet Row Pulmonary Rehab from 12/10/2020 in [facility] Cardiac and Pulmonary Rehab  Referring Provider Aleskerov      Encounter Date: 01/28/2021  Check In:  Session Check In - 01/28/21 1347      Check-In   Supervising physician immediately available to respond to emergencies See telemetry face sheet for immediately available ER MD    Location [facility]-Cardiac & Pulmonary Rehab    Staff Present Kelly Bollinger, MPA, RN;Kelly Hayes, BS, ACSM CEP, Exercise Physiologist;Laureen Brown, BS, RRT, CPFT    Virtual Visit No    Medication changes reported     No    Fall or balance concerns reported    No    Warm-up and Cool-down Performed on first and last piece of equipment    Resistance Training Performed Yes    VAD Patient? No    PAD/SET Patient? No      Pain Assessment   Currently in Pain? No/denies              Social History   Tobacco Use  Smoking Status Former Smoker  . Packs/day: 1.00  . Years: 45.00  . Pack years: 45.00  . Types: Cigarettes  . Quit date: 12/04/2007  . Years since quitting: 13.1  Smokeless Tobacco Never Used    Goals Met:  Independence with exercise equipment Exercise tolerated well No report of cardiac concerns or symptoms Strength training completed today  Goals Unmet:  Not Applicable  Comments: Pt able to follow exercise prescription today without complaint.  Will continue to monitor for progression.    Dr. Mark Miller is Medical Director for [facility] and [facility]. 

## 2021-01-30 ENCOUNTER — Other Ambulatory Visit: Payer: Self-pay

## 2021-01-30 ENCOUNTER — Encounter: Payer: Self-pay | Admitting: *Deleted

## 2021-01-30 DIAGNOSIS — J439 Emphysema, unspecified: Secondary | ICD-10-CM

## 2021-01-30 NOTE — Progress Notes (Signed)
Pulmonary Individual Treatment Plan  Patient Details  Name: Danielle Sandoval MRN: [id number] Date of Birth: 12-11-1940 Referring Provider:   Flowsheet Row Pulmonary Rehab from 12/10/2020 in [facility] Cardiac and Pulmonary Rehab  Referring Provider Karna Christmas      Initial Encounter Date:  Flowsheet Row Pulmonary Rehab from 12/10/2020 in [facility] Cardiac and Pulmonary Rehab  Date 12/10/20      Visit Diagnosis: Pulmonary emphysema, unspecified emphysema type (HCC)  Patient's Home Medications on Admission:  Current Outpatient Medications:  .  acetaminophen (TYLENOL) 650 MG suppository, SMARTSIG:1 SUPPOS Rectally Every 4 Hours PRN (Patient not taking: Reported on 05/31/2020), Disp: , Rfl:  .  apixaban (ELIQUIS) 5 MG TABS tablet, , Disp: , Rfl:  .  Ascorbic Acid (VITAMIN C) 1000 MG tablet, SMARTSIG:By Mouth, Disp: , Rfl:  .  BREO ELLIPTA 200-25 MCG/INH AEPB, SMARTSIG:1 Inhalation Via Inhaler Daily, Disp: , Rfl:  .  clopidogrel (PLAVIX) 75 MG tablet, Take 75 mg by mouth daily., Disp: , Rfl:  .  COMBIVENT RESPIMAT 20-100 MCG/ACT AERS respimat, SMARTSIG:2 Inhalation Via Inhaler 4 Times Daily (Patient not taking: Reported on 12/03/2020), Disp: , Rfl:  .  DULCOLAX 5 MG EC tablet, Take 10 mg by mouth daily., Disp: , Rfl:  .  escitalopram (LEXAPRO) 5 MG tablet, Take 5 mg by mouth daily., Disp: , Rfl:  .  ferrous sulfate 325 (65 FE) MG EC tablet, Take 1 tablet by mouth daily., Disp: , Rfl:  .  fluticasone (FLONASE) 50 MCG/ACT nasal spray, Place into the nose., Disp: , Rfl:  .  furosemide (LASIX) 20 MG tablet, Take 20 mg by mouth daily., Disp: , Rfl:  .  gabapentin (NEURONTIN) 100 MG capsule, Take 100 mg by mouth 2 (two) times daily., Disp: , Rfl:  .  levothyroxine (SYNTHROID) 25 MCG tablet, Take 25 mcg by mouth daily., Disp: , Rfl:  .  LORazepam (ATIVAN) 0.5 MG tablet, Take 0.5 mg by mouth every 4 (four) hours as needed. (Patient not taking: Reported on 05/31/2020), Disp: , Rfl:  .  MELATONIN MAXIMUM STRENGTH 5 MG  TABS, SMARTSIG:1 Tablet(s) By Mouth PRN PRN, Disp: , Rfl:  .  metoprolol succinate (TOPROL-XL) 25 MG 24 hr tablet, , Disp: , Rfl:  .  MUCINEX MAXIMUM STRENGTH 1200 MG TB12, Take 1 tablet by mouth daily., Disp: , Rfl:  .  pantoprazole (PROTONIX) 40 MG tablet, Take 40 mg by mouth daily., Disp: , Rfl:  .  VITAMIN D-1000 MAX ST 25 MCG (1000 UT) tablet, Take 1,000 Units by mouth daily., Disp: , Rfl:   Past Medical History: Past Medical History:  Diagnosis Date  . Arthritis   . COPD (chronic obstructive pulmonary disease) (HCC)   . Emphysema of lung (HCC)   . Heart disease   . Hypertension   . Thyroid disease     Tobacco Use: Social History   Tobacco Use  Smoking Status Former Smoker  . Packs/day: 1.00  . Years: 45.00  . Pack years: 45.00  . Types: Cigarettes  . Quit date: 12/04/2007  . Years since quitting: 13.1  Smokeless Tobacco Never Used    Labs: Recent Review Flowsheet Data   There is no flowsheet data to display.      Pulmonary Assessment Scores:  Pulmonary Assessment Scores    Row Name 12/10/20 1558         ADL UCSD   SOB Score total 19     Rest 1     Walk 1     Stairs 5  Bath 1     Dress 2     Shop 2           CAT Score   CAT Score 10           mMRC Score   mMRC Score 2            UCSD: Self-administered rating of dyspnea associated with activities of daily living (ADLs) 6-point scale (0 = "not at all" to 5 = "maximal or unable to do because of breathlessness")  Scoring Scores range from 0 to 120.  Minimally important difference is 5 units  CAT: CAT can identify the health impairment of COPD patients and is better correlated with disease progression.  CAT has a scoring range of zero to 40. The CAT score is classified into four groups of low (less than 10), medium (10 - 20), high (21-30) and very high (31-40) based on the impact level of disease on health status. A CAT score over 10 suggests significant symptoms.  A worsening CAT score could  be explained by an exacerbation, poor medication adherence, poor inhaler technique, or progression of COPD or comorbid conditions.  CAT MCID is 2 points  mMRC: mMRC (Modified Medical Research Council) Dyspnea Scale is used to assess the degree of baseline functional disability in patients of respiratory disease due to dyspnea. No minimal important difference is established. A decrease in score of 1 point or greater is considered a positive change.   Pulmonary Function Assessment:  Pulmonary Function Assessment - 12/03/20 1409      Breath   Shortness of Breath Yes;Limiting activity           Exercise Target Goals: Exercise Program Goal: Individual exercise prescription set using results from initial 6 min walk test and THRR while considering  patient's activity barriers and safety.   Exercise Prescription Goal: Initial exercise prescription builds to 30-45 minutes a day of aerobic activity, 2-3 days per week.  Home exercise guidelines will be given to patient during program as part of exercise prescription that the participant will acknowledge.  Education: Aerobic Exercise: - Group verbal and visual presentation on the components of exercise prescription. Introduces F.I.T.T principle from ACSM for exercise prescriptions.  Reviews F.I.T.T. principles of aerobic exercise including progression. Written material given at graduation.   Education: Resistance Exercise: - Group verbal and visual presentation on the components of exercise prescription. Introduces F.I.T.T principle from ACSM for exercise prescriptions  Reviews F.I.T.T. principles of resistance exercise including progression. Written material given at graduation.    Education: Exercise & Equipment Safety: - Individual verbal instruction and demonstration of equipment use and safety with use of the equipment. Flowsheet Row Pulmonary Rehab from 12/12/2020 in [facility] Cardiac and Pulmonary Rehab  Date 12/10/20  Educator AS   Instruction Review Code 1- Verbalizes Understanding      Education: Exercise Physiology & General Exercise Guidelines: - Group verbal and written instruction with models to review the exercise physiology of the cardiovascular system and associated critical values. Provides general exercise guidelines with specific guidelines to those with heart or lung disease.    Education: Flexibility, Balance, Mind/Body Relaxation: - Group verbal and visual presentation with interactive activity on the components of exercise prescription. Introduces F.I.T.T principle from ACSM for exercise prescriptions. Reviews F.I.T.T. principles of flexibility and balance exercise training including progression. Also discusses the mind body connection.  Reviews various relaxation techniques to help reduce and manage stress (i.e. Deep breathing, progressive muscle relaxation, and visualization). Balance handout provided  to take home. Written material given at graduation.   Activity Barriers & Risk Stratification:   6 Minute Walk:  6 Minute Walk    Row Name 12/10/20 1552         6 Minute Walk   Phase Initial     Distance 400 feet     Walk Time 5.25 minutes     # of Rest Breaks 1     MPH 0.86     METS 0.94     RPE 11     Perceived Dyspnea  2     VO2 Peak 3.3           Oxygen Initial Assessment:  Oxygen Initial Assessment - 12/03/20 1408      Home Oxygen   Home Oxygen Device Home Concentrator;E-Tanks;Portable Concentrator    Sleep Oxygen Prescription Continuous    Liters per minute 4    Home Exercise Oxygen Prescription Continuous    Liters per minute 4    Home Resting Oxygen Prescription Continuous    Liters per minute 4    Compliance with Home Oxygen Use Yes      Initial 6 min Walk   Oxygen Used Continuous    Liters per minute 4      Program Oxygen Prescription   Program Oxygen Prescription Continuous    Liters per minute 4      Intervention   Short Term Goals To learn and exhibit  compliance with exercise, home and travel O2 prescription;To learn and understand importance of monitoring SPO2 with pulse oximeter and demonstrate accurate use of the pulse oximeter.;To learn and understand importance of maintaining oxygen saturations>88%;To learn and demonstrate proper pursed lip breathing techniques or other breathing techniques.;To learn and demonstrate proper use of respiratory medications    Long  Term Goals Exhibits compliance with exercise, home and travel O2 prescription;Verbalizes importance of monitoring SPO2 with pulse oximeter and return demonstration;Maintenance of O2 saturations>88%;Exhibits proper breathing techniques, such as pursed lip breathing or other method taught during program session;Compliance with respiratory medication;Demonstrates proper use of MDI's           Oxygen Re-Evaluation:  Oxygen Re-Evaluation    Row Name 12/12/20 1348 01/10/21 1416           Program Oxygen Prescription   Program Oxygen Prescription Continuous Continuous      Liters per minute 4 4      Comments -- Danielle Sandoval is trying 3L during exercise             Home Oxygen   Home Oxygen Device Home Concentrator;E-Tanks;Portable Concentrator Home Concentrator;E-Tanks;Portable Concentrator      Sleep Oxygen Prescription Continuous Continuous      Liters per minute 4 4      Home Exercise Oxygen Prescription Continuous Continuous      Liters per minute 4 4      Home Resting Oxygen Prescription Continuous Continuous      Liters per minute 4 4      Compliance with Home Oxygen Use Yes Yes             Goals/Expected Outcomes   Short Term Goals To learn and exhibit compliance with exercise, home and travel O2 prescription;To learn and understand importance of monitoring SPO2 with pulse oximeter and demonstrate accurate use of the pulse oximeter.;To learn and understand importance of maintaining oxygen saturations>88%;To learn and demonstrate proper pursed lip breathing techniques or  other breathing techniques. To learn and exhibit compliance with exercise, home and travel  O2 prescription;To learn and understand importance of monitoring SPO2 with pulse oximeter and demonstrate accurate use of the pulse oximeter.;To learn and understand importance of maintaining oxygen saturations>88%;To learn and demonstrate proper pursed lip breathing techniques or other breathing techniques.      Long  Term Goals Exhibits compliance with exercise, home and travel O2 prescription;Verbalizes importance of monitoring SPO2 with pulse oximeter and return demonstration;Maintenance of O2 saturations>88%;Exhibits proper breathing techniques, such as pursed lip breathing or other method taught during program session Exhibits compliance with exercise, home and travel O2 prescription;Verbalizes importance of monitoring SPO2 with pulse oximeter and return demonstration;Maintenance of O2 saturations>88%;Exhibits proper breathing techniques, such as pursed lip breathing or other method taught during program session      Comments Reviewed PLB technique with pt.  Talked about how it works and it's importance in maintaining their exercise saturations. Danielle Sandoval practices PLB at home.  She has had more trouble breathing recently due to pollen.      Goals/Expected Outcomes Short: Become more profiecient at using PLB.   Long: Become independent at using PLB. Short: continue to work on PLB Long:  use PLB as needed during ADLs             Oxygen Discharge (Final Oxygen Re-Evaluation):  Oxygen Re-Evaluation - 01/10/21 1416      Program Oxygen Prescription   Program Oxygen Prescription Continuous    Liters per minute 4    Comments Tinnie is trying 3L during exercise      Home Oxygen   Home Oxygen Device Home Concentrator;E-Tanks;Portable Concentrator    Sleep Oxygen Prescription Continuous    Liters per minute 4    Home Exercise Oxygen Prescription Continuous    Liters per minute 4    Home Resting Oxygen  Prescription Continuous    Liters per minute 4    Compliance with Home Oxygen Use Yes      Goals/Expected Outcomes   Short Term Goals To learn and exhibit compliance with exercise, home and travel O2 prescription;To learn and understand importance of monitoring SPO2 with pulse oximeter and demonstrate accurate use of the pulse oximeter.;To learn and understand importance of maintaining oxygen saturations>88%;To learn and demonstrate proper pursed lip breathing techniques or other breathing techniques.    Long  Term Goals Exhibits compliance with exercise, home and travel O2 prescription;Verbalizes importance of monitoring SPO2 with pulse oximeter and return demonstration;Maintenance of O2 saturations>88%;Exhibits proper breathing techniques, such as pursed lip breathing or other method taught during program session    Comments Danielle Sandoval practices PLB at home.  She has had more trouble breathing recently due to pollen.    Goals/Expected Outcomes Short: continue to work on PLB Long:  use PLB as needed during ADLs           Initial Exercise Prescription:  Initial Exercise Prescription - 12/10/20 1500      Date of Initial Exercise RX and Referring Provider   Date 12/10/20    Referring Provider Aleskerov      Oxygen   Oxygen Continuous    Liters 2-4      Treadmill   MPH 0.5    Grade 0    Minutes 15    METs 0.94      Recumbant Bike   Level 1    RPM 60    Minutes 15    METs 1      NuStep   Level 1    SPM 80    Minutes 15  METs 1      REL-XR   Level 1    Speed 50    Minutes 15    METs 1      Biostep-RELP   Level 1    SPM 50    Minutes 15    METs 1      Prescription Details   Frequency (times per week) 3    Duration Progress to 30 minutes of continuous aerobic without signs/symptoms of physical distress      Intensity   THRR 40-80% of Max Heartrate 99-127    Ratings of Perceived Exertion 11-15    Perceived Dyspnea 0-4      Resistance Training   Training  Prescription Yes    Weight 2 lb    Reps 10-15           Perform Capillary Blood Glucose checks as needed.  Exercise Prescription Changes:  Exercise Prescription Changes    Row Name 12/10/20 1500 12/13/20 0900 12/25/20 1200 01/10/21 0900 01/22/21 1500     Response to Exercise   Blood Pressure (Admit) 130/64 120/80 116/66 104/60 110/64   Blood Pressure (Exercise) 116/66 126/62 146/72 156/70 134/68   Blood Pressure (Exit) -- 120/82 100/60 -- 132/62   Heart Rate (Admit) 73 bpm 73 bpm 70 bpm 72 bpm 70 bpm   Heart Rate (Exercise) 86 bpm 89 bpm 96 bpm 94 bpm 95 bpm   Heart Rate (Exit) 78 bpm 74 bpm 97 bpm 73 bpm 70 bpm   Oxygen Saturation (Admit) 96 % 99 % 98 % 99 % 98 %   Oxygen Saturation (Exercise) 90 % 99 % 98 % 96 % 97 %   Oxygen Saturation (Exit) 92 % 100 % 99 % 99 % 99 %   Rating of Perceived Exertion (Exercise) 11 12 15 13 11    Perceived Dyspnea (Exercise) 2 2 2 3 3    Symptoms -- none none none SOB   Comments -- -- -- SOB --   Duration -- -- Continue with 30 min of aerobic exercise without signs/symptoms of physical distress. Continue with 30 min of aerobic exercise without signs/symptoms of physical distress. Continue with 30 min of aerobic exercise without signs/symptoms of physical distress.   Intensity -- -- THRR unchanged THRR unchanged THRR unchanged     Progression   Progression -- -- Continue to progress workloads to maintain intensity without signs/symptoms of physical distress. Continue to progress workloads to maintain intensity without signs/symptoms of physical distress. Continue to progress workloads to maintain intensity without signs/symptoms of physical distress.   Average METs -- -- 1.81 2 1.73     Resistance Training   Training Prescription -- -- Yes Yes Yes   Weight -- -- 2 lb 2 lb 2 lb   Reps -- -- 10-15 10-15 10-15     Interval Training   Interval Training -- -- No No No     Oxygen   Oxygen -- -- -- -- Continuous   Liters -- -- -- -- 4      Treadmill   MPH -- -- 0.7 -- --   Grade -- -- 0 -- --   Minutes -- -- 15 -- --   METs -- -- 1.54 -- --     Recumbant Bike   Level -- -- -- 1 1   Minutes -- -- -- 15 15   METs -- -- -- 2.9 --     NuStep   Level -- -- 2 -- --   Minutes -- --  15 -- --   METs -- -- 1.8 -- --     REL-XR   Level -- -- 1 -- 1   Minutes -- -- 15 -- 15   METs -- -- 2.1 -- 2     T5 Nustep   Level -- -- -- 1 1   Minutes -- -- -- 15 15   METs -- -- -- 2 1.6     Track   Laps -- -- -- -- 13   Minutes -- -- -- -- 15   METs -- -- -- -- 1.7     Home Exercise Plan   Plans to continue exercise at -- -- -- -- Home (comment)  walking, leg exercises (consider [employer])   Frequency -- -- -- -- Add 1 additional day to program exercise sessions.   Initial Home Exercises Provided -- -- -- -- 01/10/21          Exercise Comments:  Exercise Comments    Row Name 12/12/20 1347           Exercise Comments First full day of exercise!  Patient was oriented to gym and equipment including functions, settings, policies, and procedures.  Patient's individual exercise prescription and treatment plan were reviewed.  All starting workloads were established based on the results of the 6 minute walk test done at initial orientation visit.  The plan for exercise progression was also introduced and progression will be customized based on patient's performance and goals.              Exercise Goals and Review:  Exercise Goals    Row Name 12/10/20 1556             Exercise Goals   Increase Physical Activity Yes       Intervention Provide advice, education, support and counseling about physical activity/exercise needs.;Develop an individualized exercise prescription for aerobic and resistive training based on initial evaluation findings, risk stratification, comorbidities and participant's personal goals.       Expected Outcomes Short Term: Attend rehab on a regular basis to increase amount of physical activity.;Long  Term: Add in home exercise to make exercise part of routine and to increase amount of physical activity.;Long Term: Exercising regularly at least 3-5 days a week.       Increase Strength and Stamina Yes       Intervention Provide advice, education, support and counseling about physical activity/exercise needs.;Develop an individualized exercise prescription for aerobic and resistive training based on initial evaluation findings, risk stratification, comorbidities and participant's personal goals.       Expected Outcomes Short Term: Increase workloads from initial exercise prescription for resistance, speed, and METs.;Short Term: Perform resistance training exercises routinely during rehab and add in resistance training at home;Long Term: Improve cardiorespiratory fitness, muscular endurance and strength as measured by increased METs and functional capacity ( )       Able to understand and use rate of perceived exertion (RPE) scale Yes       Intervention Provide education and explanation on how to use RPE scale       Expected Outcomes Short Term: Able to use RPE daily in rehab to express subjective intensity level;Long Term:  Able to use RPE to guide intensity level when exercising independently       Able to understand and use Dyspnea scale Yes       Intervention Provide education and explanation on how to use Dyspnea scale       Expected Outcomes  Short Term: Able to use Dyspnea scale daily in rehab to express subjective sense of shortness of breath during exertion;Long Term: Able to use Dyspnea scale to guide intensity level when exercising independently       Knowledge and understanding of Target Heart Rate Range (THRR) Yes       Intervention Provide education and explanation of THRR including how the numbers were predicted and where they are located for reference       Expected Outcomes Short Term: Able to state/look up THRR;Short Term: Able to use daily as guideline for intensity in rehab;Long  Term: Able to use THRR to govern intensity when exercising independently       Able to check pulse independently Yes       Intervention Provide education and demonstration on how to check pulse in carotid and radial arteries.;Review the importance of being able to check your own pulse for safety during independent exercise       Expected Outcomes Short Term: Able to explain why pulse checking is important during independent exercise;Long Term: Able to check pulse independently and accurately       Understanding of Exercise Prescription Yes       Intervention Provide education, explanation, and written materials on patient's individual exercise prescription       Expected Outcomes Short Term: Able to explain program exercise prescription;Long Term: Able to explain home exercise prescription to exercise independently              Exercise Goals Re-Evaluation :  Exercise Goals Re-Evaluation    Row Name 12/12/20 1347 12/25/20 1253 01/10/21 0909 01/10/21 1414 01/22/21 1533     Exercise Goal Re-Evaluation   Exercise Goals Review Increase Physical Activity;Able to understand and use rate of perceived exertion (RPE) scale;Knowledge and understanding of Target Heart Rate Range (THRR);Understanding of Exercise Prescription;Increase Strength and Stamina;Able to understand and use Dyspnea scale;Able to check pulse independently Increase Physical Activity;Increase Strength and Stamina;Understanding of Exercise Prescription Increase Physical Activity;Increase Strength and Stamina Increase Strength and Stamina;Increase Physical Activity Increase Strength and Stamina;Increase Physical Activity;Understanding of Exercise Prescription   Comments Reviewed RPE and dyspnea scales, THR and program prescription with pt today.  Pt voiced understanding and was given a copy of goals to take home. Danielle Sandoval is doing well in rehab.  She has completed her first five days of exercise so far.  She is now up 0.7 mph on the treadmill  for her full 15 min!!  We will continue to monitor her progress. Danielle Sandoval attends consistently.  Her oxygen has stayed in 90s and she is up to .9 mph on TM.  Staff will monitor progress. Reviewed home exercise with pt today.  Pt plans to walk and consider [employer] for exercise.  Reviewed THR, pulse, RPE, sign and symptoms, pulse oximetery and when to call 911 or MD.  Also discussed weather considerations and indoor options.  Pt voiced understanding. Danielle Sandoval is doing well in rehab.  She is up to 13 laps on the track and 2 METs on the XR.  We will continue to montior her progress.   Expected Outcomes Short: Use RPE daily to regulate intensity. Long: Follow program prescription in THR. Short: Continue to move up seated equipment Long: Continue to attend regulary and follow program prescription Short: continue to attend consistently Long: improve overall stamina -- Short: Continue to try level 2 on bike Long: Continue to improve stamina.          Discharge Exercise Prescription (Final Exercise  Prescription Changes):  Exercise Prescription Changes - 01/22/21 1500      Response to Exercise   Blood Pressure (Admit) 110/64    Blood Pressure (Exercise) 134/68    Blood Pressure (Exit) 132/62    Heart Rate (Admit) 70 bpm    Heart Rate (Exercise) 95 bpm    Heart Rate (Exit) 70 bpm    Oxygen Saturation (Admit) 98 %    Oxygen Saturation (Exercise) 97 %    Oxygen Saturation (Exit) 99 %    Rating of Perceived Exertion (Exercise) 11    Perceived Dyspnea (Exercise) 3    Symptoms SOB    Duration Continue with 30 min of aerobic exercise without signs/symptoms of physical distress.    Intensity THRR unchanged      Progression   Progression Continue to progress workloads to maintain intensity without signs/symptoms of physical distress.    Average METs 1.73      Resistance Training   Training Prescription Yes    Weight 2 lb    Reps 10-15      Interval Training   Interval Training No      Oxygen   Oxygen  Continuous    Liters 4      Recumbant Bike   Level 1    Minutes 15      REL-XR   Level 1    Minutes 15    METs 2      T5 Nustep   Level 1    Minutes 15    METs 1.6      Track   Laps 13    Minutes 15    METs 1.7      Home Exercise Plan   Plans to continue exercise at Home (comment)   walking, leg exercises (consider [employer])   Frequency Add 1 additional day to program exercise sessions.    Initial Home Exercises Provided 01/10/21           Nutrition:  Target Goals: Understanding of nutrition guidelines, daily intake of sodium 1500mg , cholesterol 200mg , calories 30% from fat and 7% or less from saturated fats, daily to have 5 or more servings of fruits and vegetables.  Education: All About Nutrition: -Group instruction provided by verbal, written material, interactive activities, discussions, models, and posters to present general guidelines for heart healthy nutrition including fat, fiber, MyPlate, the role of sodium in heart healthy nutrition, utilization of the nutrition label, and utilization of this knowledge for meal planning. Follow up email sent as well. Written material given at graduation.   Biometrics:  Pre Biometrics - 12/10/20 1556      Pre Biometrics   Height 5\' 3"  (1.6 m)    Weight 114 lb 9.6 oz (52 kg)    BMI (Calculated) 20.31    Single Leg Stand 6.09 seconds            Nutrition Therapy Plan and Nutrition Goals:  Nutrition Therapy & Goals - 01/02/21 1604      Nutrition Therapy   Diet Heart healthy, low Na, pulmonary MNT    Protein (specify units) 60g    Fiber 25 grams    Whole Grain Foods 3 servings    Saturated Fats 12 max. grams    Fruits and Vegetables 8 servings/day    Sodium 1.5 grams      Personal Nutrition Goals   Nutrition Goal ST:/LT: continue with current eating habits - will continue to follow up    Comments 01/04/21 (Cobie's daughter) reports that Danielle Sandoval  is eating very well. She doesn't add salt or fried food. Danielle Sandoval also reports  that when she cooks, she will use liquid oils like canola oil (olive oil gives Danielle Sandoval GI distress) She reports her eating cheerios in am, 1/2 sandwich and peanut butter bar for lunch, and dinner with protein and vegetables. She reports her appetite is doing very well. She will also have ice cream on the way home. She was 93lbs, she is now 112 lbs. She will also have cheese snacks and some fruit - will not have cheese snacks if her feet are swollen. Discussed general heart healthy eating and pulmonary MNT. Will check in with both Danielle Sandoval and Danielle Sandoval.      Intervention Plan   Intervention Prescribe, educate and counsel regarding individualized specific dietary modifications aiming towards targeted core components such as weight, hypertension, lipid management, diabetes, heart failure and other comorbidities.;Nutrition handout(s) given to patient.    Expected Outcomes Short Term Goal: Understand basic principles of dietary content, such as calories, fat, sodium, cholesterol and nutrients.;Short Term Goal: A plan has been developed with personal nutrition goals set during dietitian appointment.;Long Term Goal: Adherence to prescribed nutrition plan.           Nutrition Assessments:  MEDIFICTS Score Key:  ?70 Need to make dietary changes   40-70 Heart Healthy Diet  ? 40 Therapeutic Level Cholesterol Diet  Flowsheet Row Pulmonary Rehab from 12/10/2020 in [facility] Card[facility] and Pulmonary Re[facility]  Picture Your Plate Total Score on Admission 64     Picture Your Plate Scores:  <16 Unhealthy dietary pattern with much room for improvement.  41-50 Dietary pattern unlikely to meet recommendations for good health and room for improvement.  51-60 More healthful dietary pattern, with some room for improvement.   >60 Healthy dietary pattern, although there may be some specific behaviors that could be improved.   Nutrition Goals Re-Evaluation:  Nutrition Goals Re-Evaluation    Row Name 01/10/21 1402              Goals   Comment ST/LT : Danielle Sandoval daughter makes sure she has good variety in what she eats -              Nutrition Goals Discharge (Final Nutrition Goals Re-Evaluation):  Nutrition Goals Re-Evaluation - 01/10/21 1402      Goals   Comment ST/LT : Danielle Sandoval daughter makes sure she has good variety in what she eats -           Psychosocial: Target Goals: Acknowledge presence or absence of significant depression and/or stress, maximize coping skills, provide positive support system. Participant is able to verbalize types and ability to use techniques and skills needed for reducing stress and depression.   Education: Stress, Anxiety, and Depression - Group verbal and visual presentation to define topics covered.  Reviews how body is impacted by stress, anxiety, and depression.  Also discusses healthy ways to reduce stress and to treat/manage anxiety and depression.  Written material given at graduation.   Education: Sleep Hygiene -Provides group verbal and written instruction about how sleep can affect your health.  Define sleep hygiene, discuss sleep cycles and impact of sleep habits. Review good sleep hygiene tips.    Initial Review & Psychosocial Screening:  Initial Psych Review & Screening - 12/03/20 1414      Initial Review   Current issues with Current Psychotropic Meds;Current Sleep Concerns      Family Dynamics   Good Support System? Yes    Comments  She misses her house in [place] and is now staying with her daughter. She can look to her daughter and son in-law for support. Her husband passed away in Feb 02, 2010. She has a positvie outlook on her health.      Barriers   Psychosocial barriers to participate in program The patient should benefit from training in stress management and relaxation.      Screening Interventions   Interventions To provide support and resources with identified psychosocial needs;Provide feedback about the scores to participant;Encouraged to exercise     Expected Outcomes Short Term goal: Utilizing psychosocial counselor, staff and physician to assist with identification of specific Stressors or current issues interfering with healing process. Setting desired goal for each stressor or current issue identified.;Long Term Goal: Stressors or current issues are controlled or eliminated.;Short Term goal: Identification and review with participant of any Quality of Life or Depression concerns found by scoring the questionnaire.;Long Term goal: The participant improves quality of Life and PHQ9 Scores as seen by post scores and/or verbalization of changes           Quality of Life Scores:  Scores of 19 and below usually indicate a poorer quality of life in these areas.  A difference of  2-3 points is a clinically meaningful difference.  A difference of 2-3 points in the total score of the Quality of Life Index has been associated with significant improvement in overall quality of life, self-image, physical symptoms, and general health in studies assessing change in quality of life.  PHQ-9: Recent Review Flowsheet Data    Depression screen [facility] 2/9 12/10/2020   Decreased Interest 0   Down, Depressed, Hopeless 0   PHQ - 2 Score 0   Altered sleeping 3    Tired, decreased energy 1   Change in appetite 0   Feeling bad or failure about yourself  0   Trouble concentrating 2   Moving slowly or fidgety/restless 0   Suicidal thoughts 0   PHQ-9 Score 6   Difficult doing work/chores Not difficult at all     Interpretation of Total Score  Total Score Depression Severity:  1-4 = Minimal depression, 5-9 = Mild depression, 10-14 = Moderate depression, 15-19 = Moderately severe depression, 20-27 = Severe depression   Psychosocial Evaluation and Intervention:  Psychosocial Evaluation - 12/03/20 1418      Psychosocial Evaluation & Interventions   Interventions Encouraged to exercise with the program and follow exercise prescription;Relaxation education;Stress  management education    Comments She misses her house in [place] and is now staying with her daughter. She can look to her daughter and son in-law for support. Her husband passed away in 2010/02/02. She has a positvie outlook on her health.    Expected Outcomes Short: Exercise regularly to support mental health and notify staff of any changes. Long: maintain mental health and well being through teaching of rehab or prescribed medications independently.    Continue Psychosocial Services  Follow up required by staff           Psychosocial Re-Evaluation:  Psychosocial Re-Evaluation    Row Name 01/10/21 1403             Psychosocial Re-Evaluation   Comments Danielle Sandoval reports sleeping well  and feeling rested.  She says she has no stress concerns       Expected Outcomes Short: notify staff of any changes Long: maintain positive outlook              Psychosocial  Discharge (Final Psychosocial Re-Evaluation):  Psychosocial Re-Evaluation - 01/10/21 1403      Psychosocial Re-Evaluation   Comments Danielle Sandoval reports sleeping well  and feeling rested.  She says she has no stress concerns    Expected Outcomes Short: notify staff of any changes Long: maintain positive outlook           Education: Education Goals: Education classes will be provided on a weekly basis, covering required topics. Participant will state understanding/return demonstration of topics presented.  Learning Barriers/Preferences:  Learning Barriers/Preferences - 12/03/20 1411      Learning Barriers/Preferences   Learning Barriers None    Learning Preferences None           General Pulmonary Education Topics:  Infection Prevention: - Provides verbal and written material to individual with discussion of infection control including proper hand washing and proper equipment cleaning during exercise session. Flow[facility]  Date 12/10/20  Educator AS  Instruction  Review Code 1- Verbalizes Understanding      Falls Prevention: - Provides verbal and written material to individual with discussion of falls prevention and safety. Flowsheet [facility]  Date 12/10/20  Educator AS  Instruction Review Code 1- Verbalizes Understanding      Chronic Lung Disease Review: - Group verbal instruction with posters, models, PowerPoint presentations and videos,  to review new updates, new respiratory medications, new advancements in procedures and treatments. Providing information on websites and "800" numbers for continued self-education. Includes information about supplement oxygen, available portable oxygen systems, continuous and intermittent flow rates, oxygen safety, concentrators, and Medicare reimbursement for oxygen. Explanation of Pulmonary Drugs, including class, frequency, complications, importance of spacers, rinsing mouth after steroid MDI's, and proper cleaning methods for nebulizers. Review of basic lung anatomy and physiology related to function, structure, and complications of lung disease. Review of risk factors. Discussion about methods for diagnosing sleep apnea and types of masks and machines for OSA. Includes a review of the use of types of environmental controls: home humidity, furnaces, filters, dust mite/pet prevention, HEPA vacuums. Discussion about weather changes, air quality and the benefits of nasal washing. Instruction on Warning signs, infection symptoms, calling MD promptly, preventive modes, and value of vaccinations. Review of effective airway clearance, coughing and/or vibration techniques. Emphasizing that all should Create an Action Plan. Written material given at graduation. Flowsheet [facility]  Date 12/12/20  [facility]ator [facility]  Instruction Review Code 1- Verbalizes Understanding      AED/CPR: - Group verbal and written  instruction with the use of models to demonstrate the basic use of the AED with the basic ABC's of resuscitation.    Anatomy and Cardiac Procedures: - Group verbal and visual presentation and models provide information about basic cardiac anatomy and function. Reviews the testing methods done to diagnose heart disease and the outcomes of the test results. Describes the treatment choices: Medical Management, Angioplasty, or Coronary Bypass Surgery for treating various heart conditions including Myocardial Infarction, Angina, Valve Disease, and Cardiac Arrhythmias.  Written material given at graduation.   Medication Safety: - Group verbal and visual instruction to review commonly prescribed medications for heart and lung disease. Reviews the medication, class of the drug, and side effects. Includes the steps to properly store meds and maintain the prescription regimen.  Written material given at graduation.   Other: -Provides group and verbal instruction  on various topics (see comments)   Knowledge Questionnaire Score:  Knowledge Questionnaire Score - 12/10/20 1559      Knowledge Questionnaire Score   Pre Score 9/18            Core Components/Risk Factors/Patient Goals at Admission:  Personal Goals and Risk Factors at Admission - 12/10/20 1602      Core Components/Risk Factors/Patient Goals on Admission    Weight Management Yes;Weight Maintenance    Intervention Weight Management: Develop a combined nutrition and exercise program designed to reach desired caloric intake, while maintaining appropriate intake of nutrient and fiber, sodium and fats, and appropriate energy expenditure required for the weight goal.;Weight Management: Provide education and appropriate resources to help participant work on and attain dietary goals.;Weight Management/Obesity: Establish reasonable short term and long term weight goals.    Expected Outcomes Short Term: Continue to assess and modify interventions  until short term weight is achieved;Long Term: Adherence to nutrition and physical activity/exercise program aimed toward attainment of established weight goal;Weight Maintenance: Understanding of the daily nutrition guidelines, which includes 25-35% calories from fat, 7% or less cal from saturated fats, less than 200mg  cholesterol, less than 1.5gm of sodium, & 5 or more servings of fruits and vegetables daily;Understanding recommendations for meals to include 15-35% energy as protein, 25-35% energy from fat, 35-60% energy from carbohydrates, less than 200mg  of dietary cholesterol, 20-35 gm of total fiber daily;Understanding of distribution of calorie intake throughout the day with the consumption of 4-5 meals/snacks    Improve shortness of breath with ADL's Yes    Intervention Provide education, individualized exercise plan and daily activity instruction to help decrease symptoms of SOB with activities of daily living.    Expected Outcomes Short Term: Improve cardiorespiratory fitness to achieve a reduction of symptoms when performing ADLs;Long Term: Be able to perform more ADLs without symptoms or delay the onset of symptoms           Education:Diabetes - Individual verbal and written instruction to review signs/symptoms of diabetes, desired ranges of glucose level fasting, after meals and with exercise. Acknowledge that pre and post exercise glucose checks will be done for 3 sessions at entry of program.   Know Your Numbers and Heart Failure: - Group verbal and visual instruction to discuss disease risk factors for cardiac and pulmonary disease and treatment options.  Reviews associated critical values for Overweight/Obesity, Hypertension, Cholesterol, and Diabetes.  Discusses basics of heart failure: signs/symptoms and treatments.  Introduces Heart Failure Zone chart for action plan for heart failure.  Written material given at graduation.   Core Components/Risk Factors/Patient Goals Review:    Goals and Risk Factor Review    Row Name 01/10/21 1352             Core Components/Risk Factors/Patient Goals Review   Personal Goals Review Improve shortness of breath with ADL's;Increase knowledge of respiratory medications and ability to use respiratory devices properly.;Develop more efficient breathing techniques such as purse lipped breathing and diaphragmatic breathing and practicing self-pacing with activity.       Review Kriya has noticed she can do more at home since she has been exercising at [facility].  She does have a scooter that helps her going out.  She uses a walker at home.  She does walk at home on days not at [facility].   She takes all medication as directed and checking BP at home.       Expected Outcomes Short:  continue to take meds as directed  and practice PLB  Long:  be abel to do more ADLs with less shortness of breath              Core Components/Risk Factors/Patient Goals at Discharge (Final Review):   Goals and Risk Factor Review - 01/10/21 1352      Core Components/Risk Factors/Patient Goals Review   Personal Goals Review Improve shortness of breath with ADL's;Increase knowledge of respiratory medications and ability to use respiratory devices properly.;Develop more efficient breathing techniques such as purse lipped breathing and diaphragmatic breathing and practicing self-pacing with activity.    Review Bryna has noticed she can do more at home since she has been exercising at [facility].  She does have a scooter that helps her going out.  She uses a walker at home.  She does walk at home on days not at [facility].   She takes all medication as directed and checking BP at home.    Expected Outcomes Short:  continue to take meds as directed and practice PLB  Long:  be abel to do more ADLs with less shortness of breath           ITP Comments:  ITP Comments    Row Name 12/03/20 1410 12/03/20 1419 12/10/20 1604 12/12/20 1346 01/02/21 0803   ITP Comments Virtual Visit completed. Patient  informed on EP and RD appointment and 6 Minute walk test. Patient also informed of patient health questionnaires on My Chart. Patient Verbalizes understanding. Visit diagnosis can be found in [facility] 11/07/2020. Patient will bring her medication list when she comes with her daughter. Completed and gym orientation. Initial ITP created and sent for review to Dr. Bethann Punches, Medical Director. First full day of exercise!  Patient was oriented to gym and equipment including functions, settings, policies, and procedures.  Patient's individual exercise prescription and treatment plan were reviewed.  All starting workloads were established based on the results of the 6 minute walk test done at initial orientation visit.  The plan for exercise progression was also introduced and progression will be customized based on patient's performance and goals. 30 Day review completed. Medical Director ITP review done, changes made as directed, and signed approval by Medical Director.   Row Name 01/02/21 1627 01/30/21 1017         ITP Comments Completed initial RD consultation 30 Day review completed. Medical Director ITP review done, changes made as directed, and signed approval by Medical Director.             Comments:

## 2021-01-30 NOTE — Progress Notes (Signed)
Daily Session Note  Patient Details  Name: Danielle Sandoval MRN: [id number] Date of Birth: [birth year] Referring Provider:   Flowsheet Row Pulmonary Rehab from 12/10/2020 in [facility] Cardiac and Pulmonary Rehab  Referring Provider Lanney Gins      Encounter Date: 01/30/2021  Check In:  Session Check In - 01/30/21 1347      Check-In   Supervising physician immediately available to respond to emergencies See telemetry face sheet for immediately available ER MD    Location [facility]-Cardiac & Pulmonary Rehab    Staff Present Birdie Sons, MPA, RN;Joseph Lou Miner, [place] Exercise Physiologist    Virtual Visit No    Medication changes reported     No    Fall or balance concerns reported    No    Warm-up and Cool-down Performed on first and last piece of equipment    Resistance Training Performed Yes    VAD Patient? No    PAD/SET Patient? No      Pain Assessment   Currently in Pain? No/denies              Social History   Tobacco Use  Smoking Status Former Smoker  . Packs/day: 1.00  . Years: 45.00  . Pack years: 45.00  . Types: Cigarettes  . Quit date: 12/04/2007  . Years since quitting: 13.1  Smokeless Tobacco Never Used    Goals Met:  Independence with exercise equipment Exercise tolerated well No report of cardiac concerns or symptoms Strength training completed today  Goals Unmet:  Not Applicable  Comments: Pt able to follow exercise prescription today without complaint.  Will continue to monitor for progression.    Dr. Emily Filbert is Medical Director for [facility].

## 2021-01-31 ENCOUNTER — Other Ambulatory Visit: Payer: Self-pay

## 2021-01-31 ENCOUNTER — Encounter: Payer: Medicare Other | Admitting: *Deleted

## 2021-01-31 DIAGNOSIS — J439 Emphysema, unspecified: Secondary | ICD-10-CM

## 2021-01-31 NOTE — Progress Notes (Signed)
Daily Session Note  Patient Details  Name: Danielle Sandoval MRN: [id number] Date of Birth: [birth year] Referring Provider:   Flowsheet Row Pulmonary Rehab from 12/10/2020 in [facility] Cardiac and Pulmonary Rehab  Referring Provider Lanney Gins      Encounter Date: 01/31/2021  Check In:  Session Check In - 01/31/21 1335      Check-In   Supervising physician immediately available to respond to emergencies See telemetry face sheet for immediately available ER MD    Location [facility]-Cardiac & Pulmonary Rehab    Staff Present Renita Papa, RN BSN;Joseph [address] [place], [place], [place], CCRP, CCET    Virtual Visit No    Medication changes reported     Yes    Comments antibiotic for UTI    Fall or balance concerns reported    No    Warm-up and Cool-down Performed on first and last piece of equipment    Resistance Training Performed Yes    VAD Patient? No    PAD/SET Patient? No      Pain Assessment   Currently in Pain? No/denies              Social History   Tobacco Use  Smoking Status Former Smoker  . Packs/day: 1.00  . Years: 45.00  . Pack years: 45.00  . Types: Cigarettes  . Quit date: 12/04/2007  . Years since quitting: 13.1  Smokeless Tobacco Never Used    Goals Met:  Independence with exercise equipment Exercise tolerated well No report of cardiac concerns or symptoms Strength training completed today  Goals Unmet:  Not Applicable  Comments: Pt able to follow exercise prescription today without complaint.  Will continue to monitor for progression.    Dr. Emily Filbert is Medical Director for [place] [facility].

## 2021-02-04 ENCOUNTER — Other Ambulatory Visit: Payer: Self-pay

## 2021-02-04 DIAGNOSIS — J439 Emphysema, unspecified: Secondary | ICD-10-CM

## 2021-02-04 NOTE — Progress Notes (Signed)
Daily Session Note  Patient Details  Name: Danielle Sandoval MRN: [id number] Date of Birth: 1941/03/12 Referring Provider:   Flowsheet Row Pulmonary Rehab from 12/10/2020 in [facility] Cardiac and Pulmonary Rehab  Referring Provider Lanney Gins      Encounter Date: 02/04/2021  Check In:  Session Check In - 02/04/21 1339      Check-In   Supervising physician immediately available to respond to emergencies See telemetry face sheet for immediately available ER MD    Location [facility]-Cardiac & Pulmonary Rehab    Staff Present Birdie Sons, MPA, Mauricia Area, BS, ACSM CEP, Exercise Physiologist;Kara Eliezer Bottom, MS, ASCM CEP, Exercise Physiologist    Virtual Visit No    Medication changes reported     Yes    Comments still on antibiotic    Fall or balance concerns reported    No    Warm-up and Cool-down Performed on first and last piece of equipment    Resistance Training Performed Yes    VAD Patient? No    PAD/SET Patient? No      Pain Assessment   Currently in Pain? No/denies              Social History   Tobacco Use  Smoking Status Former Smoker  . Packs/day: 1.00  . Years: 45.00  . Pack years: 45.00  . Types: Cigarettes  . Quit date: 12/04/2007  . Years since quitting: 13.1  Smokeless Tobacco Never Used    Goals Met:  Independence with exercise equipment Exercise tolerated well No report of cardiac concerns or symptoms Strength training completed today  Goals Unmet:  Not Applicable  Comments: Pt able to follow exercise prescription today without complaint.  Will continue to monitor for progression.    Dr. Emily Filbert is Medical Director for [place].  Dr. Ottie Glazier is Medical Director for [facility] Pulmonary Rehabilitation.

## 2021-02-06 ENCOUNTER — Other Ambulatory Visit: Payer: Self-pay

## 2021-02-06 DIAGNOSIS — J439 Emphysema, unspecified: Secondary | ICD-10-CM | POA: Diagnosis not present

## 2021-02-06 NOTE — Progress Notes (Signed)
Daily Session Note  Patient Details  Name: Danielle Sandoval MRN: [id number] Date of Birth: 01-Jul-1941 Referring Provider:   Flowsheet Row Pulmonary Rehab from 12/10/2020 in [facility] Cardiac and Pulmonary Rehab  Referring Provider Lanney Gins      Encounter Date: 02/06/2021  Check In:  Session Check In - 02/06/21 1340      Check-In   Supervising physician immediately available to respond to emergencies See telemetry face sheet for immediately available ER MD    Location [facility]-Cardiac & Pulmonary Rehab    Staff Present Birdie Sons, MPA, RN;Joseph Lou Miner, MS, ASCM CEP, Exercise Physiologist    Virtual Visit No    Medication changes reported     No    Fall or balance concerns reported    No    Warm-up and Cool-down Performed on first and last piece of equipment    Resistance Training Performed Yes    VAD Patient? No    PAD/SET Patient? No      Pain Assessment   Currently in Pain? No/denies              Social History   Tobacco Use  Smoking Status Former Smoker  . Packs/day: 1.00  . Years: 45.00  . Pack years: 45.00  . Types: Cigarettes  . Quit date: 12/04/2007  . Years since quitting: 13.1  Smokeless Tobacco Never Used    Goals Met:  Independence with exercise equipment Exercise tolerated well Personal goals reviewed No report of cardiac concerns or symptoms Strength training completed today  Goals Unmet:  Not Applicable  Comments: Pt able to follow exercise prescription today without complaint.  Will continue to monitor for progression.    Dr. Emily Filbert is Medical Director for [place].  Dr. Ottie Glazier is Medical Director for [facility] Pulmonary Rehabilitation.

## 2021-02-07 ENCOUNTER — Encounter: Payer: Medicare Other | Admitting: *Deleted

## 2021-02-07 DIAGNOSIS — J439 Emphysema, unspecified: Secondary | ICD-10-CM | POA: Diagnosis not present

## 2021-02-07 NOTE — Progress Notes (Signed)
Daily Session Note  Patient Details  Name: Saje Gallop MRN: [id number] Date of Birth: 15-Jan-1941 Referring Provider:   Flowsheet Row Pulmonary Rehab from 12/10/2020 in [facility] Cardiac and Pulmonary Rehab  Referring Provider Lanney Gins      Encounter Date: 02/07/2021  Check In:  Session Check In - 02/07/21 [place]      Check-In   Supervising physician immediately available to respond to emergencies See telemetry face sheet for immediately available ER MD    Location [facility]-Cardiac & Pulmonary Rehab    Staff Present Renita Papa, RN BSN;Melissa Caiola RDN, LDN;Jessica Luan Pulling, MA, RCEP, CCRP, CCET    Virtual Visit No    Medication changes reported     No    Fall or balance concerns reported    No    Warm-up and Cool-down Performed on first and last piece of equipment    Resistance Training Performed Yes    VAD Patient? No    PAD/SET Patient? No      Pain Assessment   Currently in Pain? No/denies              Social History   Tobacco Use  Smoking Status Former Smoker  . Packs/day: 1.00  . Years: 45.00  . Pack years: 45.00  . Types: Cigarettes  . Quit date: 12/04/2007  . Years since quitting: 13.1  Smokeless Tobacco Never Used    Goals Met:  Independence with exercise equipment Exercise tolerated well No report of cardiac concerns or symptoms Strength training completed today  Goals Unmet:  Not Applicable  Comments: Pt able to follow exercise prescription today without complaint.  Will continue to monitor for progression.    Dr. Emily Filbert is Medical Director for [place].  Dr. Ottie Glazier is Medical Director for [facility] Pulmonary Rehabilitation.

## 2021-02-13 ENCOUNTER — Other Ambulatory Visit: Payer: Self-pay

## 2021-02-13 ENCOUNTER — Encounter: Payer: Medicare Other | Attending: Pulmonary Disease | Admitting: *Deleted

## 2021-02-13 DIAGNOSIS — J439 Emphysema, unspecified: Secondary | ICD-10-CM | POA: Diagnosis not present

## 2021-02-13 NOTE — Progress Notes (Signed)
Daily Session Note  Patient Details  Name: Danielle Sandoval MRN: [id number] Date of Birth: 18-Jul-1941 Referring Provider:   Flowsheet Row Pulmonary Rehab from 12/10/2020 in [facility] Cardiac and Pulmonary Rehab  Referring Provider Lanney Gins      Encounter Date: 02/13/2021  Check In:  Session Check In - 02/13/21 1333      Check-In   Supervising physician immediately available to respond to emergencies See telemetry face sheet for immediately available ER MD    Location [facility]-Cardiac & Pulmonary Rehab    Staff Present Renita Papa, RN BSN;Melissa Caiola RDN, Tawanna Solo, MS, ASCM CEP, Exercise Physiologist    Virtual Visit No    Medication changes reported     No    Fall or balance concerns reported    No    Warm-up and Cool-down Performed on first and last piece of equipment    Resistance Training Performed Yes    VAD Patient? No    PAD/SET Patient? No      Pain Assessment   Currently in Pain? No/denies              Social History   Tobacco Use  Smoking Status Former Smoker  . Packs/day: 1.00  . Years: 45.00  . Pack years: 45.00  . Types: Cigarettes  . Quit date: 12/04/2007  . Years since quitting: 13.2  Smokeless Tobacco Never Used    Goals Met:  Independence with exercise equipment Exercise tolerated well No report of cardiac concerns or symptoms Strength training completed today  Goals Unmet:  Not Applicable  Comments: Pt able to follow exercise prescription today without complaint.  Will continue to monitor for progression.    Dr. Emily Filbert is Medical Director for [place].  Dr. Ottie Glazier is Medical Director for [facility] Pulmonary Rehabilitation.

## 2021-02-14 ENCOUNTER — Encounter: Payer: Medicare Other | Admitting: *Deleted

## 2021-02-14 ENCOUNTER — Other Ambulatory Visit: Payer: Self-pay

## 2021-02-14 DIAGNOSIS — J439 Emphysema, unspecified: Secondary | ICD-10-CM

## 2021-02-14 NOTE — Progress Notes (Signed)
Daily Session Note  Patient Details  Name: Danielle Sandoval MRN: [id number] Date of Birth: 1941-04-12 Referring Provider:   Flowsheet Row Pulmonary Rehab from 12/10/2020 in [facility] Cardiac and Pulmonary Rehab  Referring Provider Lanney Gins      Encounter Date: 02/14/2021  Check In:  Session Check In - 02/14/21 1339      Check-In   Supervising physician immediately available to respond to emergencies See telemetry face sheet for immediately available ER MD    Location [facility]-Cardiac & Pulmonary Rehab    Staff Present Renita Papa, RN BSN;Joseph [address] [place], [place], [place], CCRP, CCET    Virtual Visit No    Medication changes reported     No    Fall or balance concerns reported    No    Warm-up and Cool-down Performed on first and last piece of equipment    Resistance Training Performed Yes    VAD Patient? No    PAD/SET Patient? No      Pain Assessment   Currently in Pain? No/denies              Social History   Tobacco Use  Smoking Status Former Smoker  . Packs/day: 1.00  . Years: 45.00  . Pack years: 45.00  . Types: Cigarettes  . Quit date: 12/04/2007  . Years since quitting: 13.2  Smokeless Tobacco Never Used    Goals Met:  Independence with exercise equipment Exercise tolerated well No report of cardiac concerns or symptoms Strength training completed today  Goals Unmet:  Not Applicable  Comments: Pt able to follow exercise prescription today without complaint.  Will continue to monitor for progression.    Dr. Emily Filbert is Medical Director for [place].  Dr. Ottie Glazier is Medical Director for [facility] Pulmonary Rehabilitation.

## 2021-02-18 ENCOUNTER — Other Ambulatory Visit: Payer: Self-pay

## 2021-02-18 DIAGNOSIS — J439 Emphysema, unspecified: Secondary | ICD-10-CM | POA: Diagnosis not present

## 2021-02-18 NOTE — Progress Notes (Signed)
Daily Session Note  Patient Details  Name: Danielle Sandoval MRN: [id number] Date of Birth: 12/14/1940 Referring Provider:   Flowsheet Row Pulmonary Rehab from 12/10/2020 in [facility] Cardiac and Pulmonary Rehab  Referring Provider Aleskerov      Encounter Date: 02/18/2021  Check In:  Session Check In - 02/18/21 1347      Check-In   Supervising physician immediately available to respond to emergencies See telemetry face sheet for immediately available ER MD    Location [facility]-Cardiac & Pulmonary Rehab    Staff Present Kelly Bollinger, MPA, RN;Kelly Hayes, BS, ACSM CEP, Exercise Physiologist;Jessica Hawkins, MA, RCEP, CCRP, CCET    Virtual Visit No    Medication changes reported     No    Fall or balance concerns reported    No    Warm-up and Cool-down Performed on first and last piece of equipment    Resistance Training Performed Yes    VAD Patient? No    PAD/SET Patient? No      Pain Assessment   Currently in Pain? No/denies              Social History   Tobacco Use  Smoking Status Former Smoker  . Packs/day: 1.00  . Years: 45.00  . Pack years: 45.00  . Types: Cigarettes  . Quit date: 12/04/2007  . Years since quitting: 13.2  Smokeless Tobacco Never Used    Goals Met:  Independence with exercise equipment Exercise tolerated well No report of cardiac concerns or symptoms Strength training completed today  Goals Unmet:  Not Applicable  Comments: Pt able to follow exercise prescription today without complaint.  Will continue to monitor for progression.    Dr. Mark Miller is Medical Director for [facility].  Dr. Fuad Aleskerov is Medical Director for [facility] Pulmonary Rehabilitation. 

## 2021-02-20 ENCOUNTER — Telehealth: Payer: Self-pay

## 2021-02-20 DIAGNOSIS — J439 Emphysema, unspecified: Secondary | ICD-10-CM

## 2021-02-20 NOTE — Progress Notes (Signed)
Pulmonary Individual Treatment Plan  Patient Details  Name: Danielle Sandoval MRN: [id number] Date of Birth: Nov 14, 1940 Referring Provider:   Flowsheet Row Pulmonary Rehab from 12/10/2020 in [facility] Cardiac and Pulmonary Rehab  Referring Provider Karna Christmas      Initial Encounter Date:  Flowsheet Row Pulmonary Rehab from 12/10/2020 in [facility] Cardiac and Pulmonary Rehab  Date 12/10/20      Visit Diagnosis: Pulmonary emphysema, unspecified emphysema type (HCC)  Patient's Home Medications on Admission:  Current Outpatient Medications:  .  acetaminophen (TYLENOL) 650 MG suppository, SMARTSIG:1 SUPPOS Rectally Every 4 Hours PRN (Patient not taking: Reported on 05/31/2020), Disp: , Rfl:  .  apixaban (ELIQUIS) 5 MG TABS tablet, , Disp: , Rfl:  .  Ascorbic Acid (VITAMIN C) 1000 MG tablet, SMARTSIG:By Mouth, Disp: , Rfl:  .  BREO ELLIPTA 200-25 MCG/INH AEPB, SMARTSIG:1 Inhalation Via Inhaler Daily, Disp: , Rfl:  .  clopidogrel (PLAVIX) 75 MG tablet, Take 75 mg by mouth daily., Disp: , Rfl:  .  COMBIVENT RESPIMAT 20-100 MCG/ACT AERS respimat, SMARTSIG:2 Inhalation Via Inhaler 4 Times Daily (Patient not taking: Reported on 12/03/2020), Disp: , Rfl:  .  DULCOLAX 5 MG EC tablet, Take 10 mg by mouth daily., Disp: , Rfl:  .  escitalopram (LEXAPRO) 5 MG tablet, Take 5 mg by mouth daily., Disp: , Rfl:  .  ferrous sulfate 325 (65 FE) MG EC tablet, Take 1 tablet by mouth daily., Disp: , Rfl:  .  fluticasone (FLONASE) 50 MCG/ACT nasal spray, Place into the nose., Disp: , Rfl:  .  furosemide (LASIX) 20 MG tablet, Take 20 mg by mouth daily., Disp: , Rfl:  .  gabapentin (NEURONTIN) 100 MG capsule, Take 100 mg by mouth 2 (two) times daily., Disp: , Rfl:  .  levothyroxine (SYNTHROID) 25 MCG tablet, Take 25 mcg by mouth daily., Disp: , Rfl:  .  LORazepam (ATIVAN) 0.5 MG tablet, Take 0.5 mg by mouth every 4 (four) hours as needed. (Patient not taking: Reported on 05/31/2020), Disp: , Rfl:  .  MELATONIN MAXIMUM STRENGTH 5 MG  TABS, SMARTSIG:1 Tablet(s) By Mouth PRN PRN, Disp: , Rfl:  .  metoprolol succinate (TOPROL-XL) 25 MG 24 hr tablet, , Disp: , Rfl:  .  MUCINEX MAXIMUM STRENGTH 1200 MG TB12, Take 1 tablet by mouth daily., Disp: , Rfl:  .  pantoprazole (PROTONIX) 40 MG tablet, Take 40 mg by mouth daily., Disp: , Rfl:  .  VITAMIN D-1000 MAX ST 25 MCG (1000 UT) tablet, Take 1,000 Units by mouth daily., Disp: , Rfl:   Past Medical History: Past Medical History:  Diagnosis Date  . Arthritis   . COPD (chronic obstructive pulmonary disease) (HCC)   . Emphysema of lung (HCC)   . Heart disease   . Hypertension   . Thyroid disease     Tobacco Use: Social History   Tobacco Use  Smoking Status Former Smoker  . Packs/day: 1.00  . Years: 45.00  . Pack years: 45.00  . Types: Cigarettes  . Quit date: 12/04/2007  . Years since quitting: 13.2  Smokeless Tobacco Never Used    Labs: Recent Review Flowsheet Data   There is no flowsheet data to display.      Pulmonary Assessment Scores:  Pulmonary Assessment Scores    Row Name 12/10/20 1558         ADL UCSD   SOB Score total 19     Rest 1     Walk 1     Stairs 5  Bath 1     Dress 2     Shop 2           CAT Score   CAT Score 10           mMRC Score   mMRC Score 2            UCSD: Self-administered rating of dyspnea associated with activities of daily living (ADLs) 6-point scale (0 = "not at all" to 5 = "maximal or unable to do because of breathlessness")  Scoring Scores range from 0 to 120.  Minimally important difference is 5 units  CAT: CAT can identify the health impairment of COPD patients and is better correlated with disease progression.  CAT has a scoring range of zero to 40. The CAT score is classified into four groups of low (less than 10), medium (10 - 20), high (21-30) and very high (31-40) based on the impact level of disease on health status. A CAT score over 10 suggests significant symptoms.  A worsening CAT score could  be explained by an exacerbation, poor medication adherence, poor inhaler technique, or progression of COPD or comorbid conditions.  CAT MCID is 2 points  mMRC: mMRC (Modified Medical Research Council) Dyspnea Scale is used to assess the degree of baseline functional disability in patients of respiratory disease due to dyspnea. No minimal important difference is established. A decrease in score of 1 point or greater is considered a positive change.   Pulmonary Function Assessment:  Pulmonary Function Assessment - 12/03/20 1409      Breath   Shortness of Breath Yes;Limiting activity           Exercise Target Goals: Exercise Program Goal: Individual exercise prescription set using results from initial 6 min walk test and THRR while considering  patient's activity barriers and safety.   Exercise Prescription Goal: Initial exercise prescription builds to 30-45 minutes a day of aerobic activity, 2-3 days per week.  Home exercise guidelines will be given to patient during program as part of exercise prescription that the participant will acknowledge.  Education: Aerobic Exercise: - Group verbal and visual presentation on the components of exercise prescription. Introduces F.I.T.T principle from ACSM for exercise prescriptions.  Reviews F.I.T.T. principles of aerobic exercise including progression. Written material given at graduation.   Education: Resistance Exercise: - Group verbal and visual presentation on the components of exercise prescription. Introduces F.I.T.T principle from ACSM for exercise prescriptions  Reviews F.I.T.T. principles of resistance exercise including progression. Written material given at graduation.    Education: Exercise & Equipment Safety: - Individual verbal instruction and demonstration of equipment use and safety with use of the equipment. Flowsheet Row Pulmonary Rehab from 12/12/2020 in [facility] Cardiac and Pulmonary Rehab  Date 12/10/20  Educator AS   Instruction Review Code 1- Verbalizes Understanding      Education: Exercise Physiology & General Exercise Guidelines: - Group verbal and written instruction with models to review the exercise physiology of the cardiovascular system and associated critical values. Provides general exercise guidelines with specific guidelines to those with heart or lung disease.    Education: Flexibility, Balance, Mind/Body Relaxation: - Group verbal and visual presentation with interactive activity on the components of exercise prescription. Introduces F.I.T.T principle from ACSM for exercise prescriptions. Reviews F.I.T.T. principles of flexibility and balance exercise training including progression. Also discusses the mind body connection.  Reviews various relaxation techniques to help reduce and manage stress (i.e. Deep breathing, progressive muscle relaxation, and visualization). Balance handout provided  to take home. Written material given at graduation.   Activity Barriers & Risk Stratification:   6 Minute Walk:  6 Minute Walk    Row Name 12/10/20 1552         6 Minute Walk   Phase Initial     Distance 400 feet     Walk Time 5.25 minutes     # of Rest Breaks 1     MPH 0.86     METS 0.94     RPE 11     Perceived Dyspnea  2     VO2 Peak 3.3           Oxygen Initial Assessment:  Oxygen Initial Assessment - 12/03/20 1408      Home Oxygen   Home Oxygen Device Home Concentrator;E-Tanks;Portable Concentrator    Sleep Oxygen Prescription Continuous    Liters per minute 4    Home Exercise Oxygen Prescription Continuous    Liters per minute 4    Home Resting Oxygen Prescription Continuous    Liters per minute 4    Compliance with Home Oxygen Use Yes      Initial 6 min Walk   Oxygen Used Continuous    Liters per minute 4      Program Oxygen Prescription   Program Oxygen Prescription Continuous    Liters per minute 4      Intervention   Short Term Goals To learn and exhibit  compliance with exercise, home and travel O2 prescription;To learn and understand importance of monitoring SPO2 with pulse oximeter and demonstrate accurate use of the pulse oximeter.;To learn and understand importance of maintaining oxygen saturations>88%;To learn and demonstrate proper pursed lip breathing techniques or other breathing techniques.;To learn and demonstrate proper use of respiratory medications    Long  Term Goals Exhibits compliance with exercise, home and travel O2 prescription;Verbalizes importance of monitoring SPO2 with pulse oximeter and return demonstration;Maintenance of O2 saturations>88%;Exhibits proper breathing techniques, such as pursed lip breathing or other method taught during program session;Compliance with respiratory medication;Demonstrates proper use of MDI's           Oxygen Re-Evaluation:  Oxygen Re-Evaluation    Row Name 12/12/20 1348 01/10/21 1416 02/06/21 1420         Program Oxygen Prescription   Program Oxygen Prescription Continuous Continuous Continuous     Liters per minute 4 4 4      Comments -- Lestine is trying 3L during exercise --           Home Oxygen   Home Oxygen Device Home Concentrator;E-Tanks;Portable Concentrator Home Concentrator;E-Tanks;Portable Concentrator Home Concentrator;E-Tanks;Portable Concentrator     Sleep Oxygen Prescription Continuous Continuous Continuous     Liters per minute 4 4 4      Home Exercise Oxygen Prescription Continuous Continuous Continuous     Liters per minute 4 4 4      Home Resting Oxygen Prescription Continuous Continuous Continuous     Liters per minute 4 4 4      Compliance with Home Oxygen Use Yes Yes Yes           Goals/Expected Outcomes   Short Term Goals To learn and exhibit compliance with exercise, home and travel O2 prescription;To learn and understand importance of monitoring SPO2 with pulse oximeter and demonstrate accurate use of the pulse oximeter.;To learn and understand importance of  maintaining oxygen saturations>88%;To learn and demonstrate proper pursed lip breathing techniques or other breathing techniques. To learn and exhibit compliance with exercise, home and travel  O2 prescription;To learn and understand importance of monitoring SPO2 with pulse oximeter and demonstrate accurate use of the pulse oximeter.;To learn and understand importance of maintaining oxygen saturations>88%;To learn and demonstrate proper pursed lip breathing techniques or other breathing techniques. To learn and exhibit compliance with exercise, home and travel O2 prescription;To learn and understand importance of monitoring SPO2 with pulse oximeter and demonstrate accurate use of the pulse oximeter.;To learn and understand importance of maintaining oxygen saturations>88%;To learn and demonstrate proper pursed lip breathing techniques or other breathing techniques.     Long  Term Goals Exhibits compliance with exercise, home and travel O2 prescription;Verbalizes importance of monitoring SPO2 with pulse oximeter and return demonstration;Maintenance of O2 saturations>88%;Exhibits proper breathing techniques, such as pursed lip breathing or other method taught during program session Exhibits compliance with exercise, home and travel O2 prescription;Verbalizes importance of monitoring SPO2 with pulse oximeter and return demonstration;Maintenance of O2 saturations>88%;Exhibits proper breathing techniques, such as pursed lip breathing or other method taught during program session Exhibits compliance with exercise, home and travel O2 prescription;Verbalizes importance of monitoring SPO2 with pulse oximeter and return demonstration;Maintenance of O2 saturations>88%;Exhibits proper breathing techniques, such as pursed lip breathing or other method taught during program session     Comments Reviewed PLB technique with pt.  Talked about how it works and it's importance in maintaining their exercise saturations. Adalai  practices PLB at home.  She has had more trouble breathing recently due to pollen. Kla has been able to shower on her own and do some other ADLS.     Goals/Expected Outcomes Short: Become more profiecient at using PLB.   Long: Become independent at using PLB. Short: continue to work on PLB Long:  use PLB as needed during ADLs Short:  practice PLB Long: become proficient with PLB            Oxygen Discharge (Final Oxygen Re-Evaluation):  Oxygen Re-Evaluation - 02/06/21 1420      Program Oxygen Prescription   Program Oxygen Prescription Continuous    Liters per minute 4      Home Oxygen   Home Oxygen Device Home Concentrator;E-Tanks;Portable Concentrator    Sleep Oxygen Prescription Continuous    Liters per minute 4    Home Exercise Oxygen Prescription Continuous    Liters per minute 4    Home Resting Oxygen Prescription Continuous    Liters per minute 4    Compliance with Home Oxygen Use Yes      Goals/Expected Outcomes   Short Term Goals To learn and exhibit compliance with exercise, home and travel O2 prescription;To learn and understand importance of monitoring SPO2 with pulse oximeter and demonstrate accurate use of the pulse oximeter.;To learn and understand importance of maintaining oxygen saturations>88%;To learn and demonstrate proper pursed lip breathing techniques or other breathing techniques.    Long  Term Goals Exhibits compliance with exercise, home and travel O2 prescription;Verbalizes importance of monitoring SPO2 with pulse oximeter and return demonstration;Maintenance of O2 saturations>88%;Exhibits proper breathing techniques, such as pursed lip breathing or other method taught during program session    Comments Sammie has been able to shower on her own and do some other ADLS.    Goals/Expected Outcomes Short:  practice PLB Long: become proficient with PLB           Initial Exercise Prescription:  Initial Exercise Prescription - 12/10/20 1500      Date of  Initial Exercise RX and Referring Provider   Date 12/10/20    Referring Provider  Aleskerov      Oxygen   Oxygen Continuous    Liters 2-4      Treadmill   MPH 0.5    Grade 0    Minutes 15    METs 0.94      Recumbant Bike   Level 1    RPM 60    Minutes 15    METs 1      NuStep   Level 1    SPM 80    Minutes 15    METs 1      REL-XR   Level 1    Speed 50    Minutes 15    METs 1      Biostep-RELP   Level 1    SPM 50    Minutes 15    METs 1      Prescription Details   Frequency (times per week) 3    Duration Progress to 30 minutes of continuous aerobic without signs/symptoms of physical distress      Intensity   THRR 40-80% of Max Heartrate 99-127    Ratings of Perceived Exertion 11-15    Perceived Dyspnea 0-4      Resistance Training   Training Prescription Yes    Weight 2 lb    Reps 10-15           Perform Capillary Blood Glucose checks as needed.  Exercise Prescription Changes:  Exercise Prescription Changes    Row Name 12/10/20 1500 12/13/20 0900 12/25/20 1200 01/10/21 0900 01/22/21 1500     Response to Exercise   Blood Pressure (Admit) 130/64 120/80 116/66 104/60 110/64   Blood Pressure (Exercise) 116/66 126/62 146/72 156/70 134/68   Blood Pressure (Exit) -- 120/82 100/60 -- 132/62   Heart Rate (Admit) 73 bpm 73 bpm 70 bpm 72 bpm 70 bpm   Heart Rate (Exercise) 86 bpm 89 bpm 96 bpm 94 bpm 95 bpm   Heart Rate (Exit) 78 bpm 74 bpm 97 bpm 73 bpm 70 bpm   Oxygen Saturation (Admit) 96 % 99 % 98 % 99 % 98 %   Oxygen Saturation (Exercise) 90 % 99 % 98 % 96 % 97 %   Oxygen Saturation (Exit) 92 % 100 % 99 % 99 % 99 %   Rating of Perceived Exertion (Exercise) 11 12 15 13 11    Perceived Dyspnea (Exercise) 2 2 2 3 3    Symptoms -- none none none SOB   Comments -- -- -- SOB --   Duration -- -- Continue with 30 min of aerobic exercise without signs/symptoms of physical distress. Continue with 30 min of aerobic exercise without signs/symptoms of physical  distress. Continue with 30 min of aerobic exercise without signs/symptoms of physical distress.   Intensity -- -- THRR unchanged THRR unchanged THRR unchanged     Progression   Progression -- -- Continue to progress workloads to maintain intensity without signs/symptoms of physical distress. Continue to progress workloads to maintain intensity without signs/symptoms of physical distress. Continue to progress workloads to maintain intensity without signs/symptoms of physical distress.   Average METs -- -- 1.81 2 1.73     Resistance Training   Training Prescription -- -- Yes Yes Yes   Weight -- -- 2 lb 2 lb 2 lb   Reps -- -- 10-15 10-15 10-15     Interval Training   Interval Training -- -- No No No     Oxygen   Oxygen -- -- -- -- Continuous  Liters -- -- -- -- 4     Treadmill   MPH -- -- 0.7 -- --   Grade -- -- 0 -- --   Minutes -- -- 15 -- --   METs -- -- 1.54 -- --     Recumbant Bike   Level -- -- -- 1 1   Minutes -- -- -- 15 15   METs -- -- -- 2.9 --     NuStep   Level -- -- 2 -- --   Minutes -- -- 15 -- --   METs -- -- 1.8 -- --     REL-XR   Level -- -- 1 -- 1   Minutes -- -- 15 -- 15   METs -- -- 2.1 -- 2     T5 Nustep   Level -- -- -- 1 1   Minutes -- -- -- 15 15   METs -- -- -- 2 1.6     Track   Laps -- -- -- -- 13   Minutes -- -- -- -- 15   METs -- -- -- -- 1.7     Home Exercise Plan   Plans to continue exercise at -- -- -- -- Home (comment)  walking, leg exercises (consider [employer])   Frequency -- -- -- -- Add 1 additional day to program exercise sessions.   Initial Home Exercises Provided -- -- -- -- 01/10/21   Row Name 02/06/21 1600             Response to Exercise   Blood Pressure (Admit) 132/82       Blood Pressure (Exercise) 152/82       Blood Pressure (Exit) 108/68       Heart Rate (Admit) 79 bpm       Heart Rate (Exercise) 79 bpm       Heart Rate (Exit) 72 bpm       Oxygen Saturation (Admit) 94 %       Oxygen Saturation (Exercise) 94  %       Oxygen Saturation (Exit) 94 %       Rating of Perceived Exertion (Exercise) 11       Perceived Dyspnea (Exercise) 2       Duration Continue with 30 min of aerobic exercise without signs/symptoms of physical distress.       Intensity THRR unchanged               Progression   Progression Continue to progress workloads to maintain intensity without signs/symptoms of physical distress.       Average METs 2.3               Resistance Training   Training Prescription Yes       Weight 2 lb       Reps 10-15               Interval Training   Interval Training No               Oxygen   Oxygen Continuous       Liters 4               REL-XR   Level 1       Minutes 15       METs 2.3              Exercise Comments:  Exercise Comments    Row Name 12/12/20 1347  Exercise Comments First full day of exercise!  Patient was oriented to gym and equipment including functions, settings, policies, and procedures.  Patient's individual exercise prescription and treatment plan were reviewed.  All starting workloads were established based on the results of the 6 minute walk test done at initial orientation visit.  The plan for exercise progression was also introduced and progression will be customized based on patient's performance and goals.              Exercise Goals and Review:  Exercise Goals    Row Name 12/10/20 1556             Exercise Goals   Increase Physical Activity Yes       Intervention Provide advice, education, support and counseling about physical activity/exercise needs.;Develop an individualized exercise prescription for aerobic and resistive training based on initial evaluation findings, risk stratification, comorbidities and participant's personal goals.       Expected Outcomes Short Term: Attend rehab on a regular basis to increase amount of physical activity.;Long Term: Add in home exercise to make exercise part of routine and to increase  amount of physical activity.;Long Term: Exercising regularly at least 3-5 days a week.       Increase Strength and Stamina Yes       Intervention Provide advice, education, support and counseling about physical activity/exercise needs.;Develop an individualized exercise prescription for aerobic and resistive training based on initial evaluation findings, risk stratification, comorbidities and participant's personal goals.       Expected Outcomes Short Term: Increase workloads from initial exercise prescription for resistance, speed, and METs.;Short Term: Perform resistance training exercises routinely during rehab and add in resistance training at home;Long Term: Improve cardiorespiratory fitness, muscular endurance and strength as measured by increased METs and functional capacity ( )       Able to understand and use rate of perceived exertion (RPE) scale Yes       Intervention Provide education and explanation on how to use RPE scale       Expected Outcomes Short Term: Able to use RPE daily in rehab to express subjective intensity level;Long Term:  Able to use RPE to guide intensity level when exercising independently       Able to understand and use Dyspnea scale Yes       Intervention Provide education and explanation on how to use Dyspnea scale       Expected Outcomes Short Term: Able to use Dyspnea scale daily in rehab to express subjective sense of shortness of breath during exertion;Long Term: Able to use Dyspnea scale to guide intensity level when exercising independently       Knowledge and understanding of Target Heart Rate Range (THRR) Yes       Intervention Provide education and explanation of THRR including how the numbers were predicted and where they are located for reference       Expected Outcomes Short Term: Able to state/look up THRR;Short Term: Able to use daily as guideline for intensity in rehab;Long Term: Able to use THRR to govern intensity when exercising independently        Able to check pulse independently Yes       Intervention Provide education and demonstration on how to check pulse in carotid and radial arteries.;Review the importance of being able to check your own pulse for safety during independent exercise       Expected Outcomes Short Term: Able to explain why pulse checking is important during independent exercise;Long  Term: Able to check pulse independently and accurately       Understanding of Exercise Prescription Yes       Intervention Provide education, explanation, and written materials on patient's individual exercise prescription       Expected Outcomes Short Term: Able to explain program exercise prescription;Long Term: Able to explain home exercise prescription to exercise independently              Exercise Goals Re-Evaluation :  Exercise Goals Re-Evaluation    Row Name 12/12/20 1347 12/25/20 1253 01/10/21 0909 01/10/21 1414 01/22/21 1533     Exercise Goal Re-Evaluation   Exercise Goals Review Increase Physical Activity;Able to understand and use rate of perceived exertion (RPE) scale;Knowledge and understanding of Target Heart Rate Range (THRR);Understanding of Exercise Prescription;Increase Strength and Stamina;Able to understand and use Dyspnea scale;Able to check pulse independently Increase Physical Activity;Increase Strength and Stamina;Understanding of Exercise Prescription Increase Physical Activity;Increase Strength and Stamina Increase Strength and Stamina;Increase Physical Activity Increase Strength and Stamina;Increase Physical Activity;Understanding of Exercise Prescription   Comments Reviewed RPE and dyspnea scales, THR and program prescription with pt today.  Pt voiced understanding and was given a copy of goals to take home. Kileen is doing well in rehab.  She has completed her first five days of exercise so far.  She is now up 0.7 mph on the treadmill for her full 15 min!!  We will continue to monitor her progress. Sofhia attends  consistently.  Her oxygen has stayed in 90s and she is up to .9 mph on TM.  Staff will monitor progress. Reviewed home exercise with pt today.  Pt plans to walk and consider [employer] for exercise.  Reviewed THR, pulse, RPE, sign and symptoms, pulse oximetery and when to call 911 or MD.  Also discussed weather considerations and indoor options.  Pt voiced understanding. Kadince is doing well in rehab.  She is up to 13 laps on the track and 2 METs on the XR.  We will continue to montior her progress.   Expected Outcomes Short: Use RPE daily to regulate intensity. Long: Follow program prescription in THR. Short: Continue to move up seated equipment Long: Continue to attend regulary and follow program prescription Short: continue to attend consistently Long: improve overall stamina -- Short: Continue to try level 2 on bike Long: Continue to improve stamina.   Row Name 02/06/21 1417             Exercise Goal Re-Evaluation   Exercise Goals Review Increase Physical Activity;Increase Strength and Stamina       Comments Ndidi continues to do well with exercise.  Staff will encourage trying 3 lb for strength work.       Expected Outcomes Short: try 3 lb weights Long: continue to build strength              Discharge Exercise Prescription (Final Exercise Prescription Changes):  Exercise Prescription Changes - 02/06/21 1600      Response to Exercise   Blood Pressure (Admit) 132/82    Blood Pressure (Exercise) 152/82    Blood Pressure (Exit) 108/68    Heart Rate (Admit) 79 bpm    Heart Rate (Exercise) 79 bpm    Heart Rate (Exit) 72 bpm    Oxygen Saturation (Admit) 94 %    Oxygen Saturation (Exercise) 94 %    Oxygen Saturation (Exit) 94 %    Rating of Perceived Exertion (Exercise) 11    Perceived Dyspnea (Exercise) 2  Duration Continue with 30 min of aerobic exercise without signs/symptoms of physical distress.    Intensity THRR unchanged      Progression   Progression Continue to progress  workloads to maintain intensity without signs/symptoms of physical distress.    Average METs 2.3      Resistance Training   Training Prescription Yes    Weight 2 lb    Reps 10-15      Interval Training   Interval Training No      Oxygen   Oxygen Continuous    Liters 4      REL-XR   Level 1    Minutes 15    METs 2.3           Nutrition:  Target Goals: Understanding of nutrition guidelines, daily intake of sodium 1500mg , cholesterol 200mg , calories 30% from fat and 7% or less from saturated fats, daily to have 5 or more servings of fruits and vegetables.  Education: All About Nutrition: -Group instruction provided by verbal, written material, interactive activities, discussions, models, and posters to present general guidelines for heart healthy nutrition including fat, fiber, MyPlate, the role of sodium in heart healthy nutrition, utilization of the nutrition label, and utilization of this knowledge for meal planning. Follow up email sent as well. Written material given at graduation.   Biometrics:  Pre Biometrics - 12/10/20 1556      Pre Biometrics   Height  (1.6 m)    Weight 114 lb 9.6 oz (52 kg)    BMI (Calculated) 20.31    Single Leg Stand 6.09 seconds            Nutrition Therapy Plan and Nutrition Goals:  Nutrition Therapy & Goals - 01/02/21 1604      Nutrition Therapy   Diet Heart healthy, low Na, pulmonary MNT    Protein (specify units) 60g    Fiber 25 grams    Whole Grain Foods 3 servings    Saturated Fats 12 max. grams    Fruits and Vegetables 8 servings/day    Sodium 1.5 grams      Personal Nutrition Goals   Nutrition Goal ST:/LT: continue with current eating habits - will continue to follow up    Comments Selena Batten (Amyiah's daughter) reports that Vaneta is eating very well. She doesn't add salt or fried food. Selena Batten also reports that when she cooks, she will use liquid oils like canola oil (olive oil gives [place] GI distress) She reports her eating  cheerios in am, 1/2 sandwich and peanut butter bar for lunch, and dinner with protein and vegetables. She reports her appetite is doing very well. She will also have ice cream on the way home. She was 93lbs, she is now 112 lbs. She will also have cheese snacks and some fruit - will not have cheese snacks if her feet are swollen. Discussed general heart healthy eating and pulmonary MNT. Will check in with both Okey Regal and Selena Batten.      Intervention Plan   Intervention Prescribe, educate and counsel regarding individualized specific dietary modifications aiming towards targeted core components such as weight, hypertension, lipid management, diabetes, heart failure and other comorbidities.;Nutrition handout(s) given to patient.    Expected Outcomes Short Term Goal: Understand basic principles of dietary content, such as calories, fat, sodium, cholesterol and nutrients.;Short Term Goal: A plan has been developed with personal nutrition goals set during dietitian appointment.;Long Term Goal: Adherence to prescribed nutrition plan.  Nutrition Assessments:  MEDIFICTS Score Key:  ?70 Need to make dietary changes   40-70 Heart Healthy Diet  ? 40 Therapeutic Level Cholesterol Diet  Flowsheet Row Pulmonary Rehab from 12/10/2020 in [facility]  Picture Your Plate Total Score on Admission 64     Picture Your Plate Scores:  <36 Unhealthy dietary pattern with much room for improvement.  41-50 Dietary pattern unlikely to meet recommendations for good health and room for improvement.  51-60 More healthful dietary pattern, with some room for improvement.   >60 Healthy dietary pattern, although there may be some specific behaviors that could be improved.   Nutrition Goals Re-Evaluation:  Nutrition Goals Re-Evaluation    Row Name 01/10/21 1402 02/06/21 1417           Goals   Comment ST/LT : Carols daughter makes sure she has good variety in what she eats - Lasonja is up to  118 lb - from 80 whe she moved here.  She has seen improvement in her abaility to do ADLs.      Expected Outcome -- ST/LT - conitnue to eat healthy balanced diet             Nutrition Goals Discharge (Final Nutrition Goals Re-Evaluation):  Nutrition Goals Re-Evaluation - 02/06/21 1417      Goals   Comment Deklyn is up to 118 lb - from 26 whe she moved here.  She has seen improvement in her abaility to do ADLs.    Expected Outcome ST/LT - conitnue to eat healthy balanced diet           Psychosocial: Target Goals: Acknowledge presence or absence of significant depression and/or stress, maximize coping skills, provide positive support system. Participant is able to verbalize types and ability to use techniques and skills needed for reducing stress and depression.   Education: Stress, Anxiety, and Depression - Group verbal and visual presentation to define topics covered.  Reviews how body is impacted by stress, anxiety, and depression.  Also discusses healthy ways to reduce stress and to treat/manage anxiety and depression.  Written material given at graduation.   Education: Sleep Hygiene -Provides group verbal and written instruction about how sleep can affect your health.  Define sleep hygiene, discuss sleep cycles and impact of sleep habits. Review good sleep hygiene tips.    Initial Review & Psychosocial Screening:  Initial Psych Review & Screening - 12/03/20 1414      Initial Review   Current issues with Current Psychotropic Meds;Current Sleep Concerns      Family Dynamics   Good Support System? Yes    Comments She misses her house in [place] and is now staying with her daughter. She can look to her daughter and son in-law for support. Her husband passed away in Jan 29, 2010. She has a positvie outlook on her health.      Barriers   Psychosocial barriers to participate in program The patient should benefit from training in stress management and relaxation.      Screening Interventions    Interventions To provide support and resources with identified psychosocial needs;Provide feedback about the scores to participant;Encouraged to exercise    Expected Outcomes Short Term goal: Utilizing psychosocial counselor, staff and physician to assist with identification of specific Stressors or current issues interfering with healing process. Setting desired goal for each stressor or current issue identified.;Long Term Goal: Stressors or current issues are controlled or eliminated.;Short Term goal: Identification and review with participant of any Quality  of Life or Depression concerns found by scoring the questionnaire.;Long Term goal: The participant improves quality of Life and PHQ9 Scores as seen by post scores and/or verbalization of changes           Quality of Life Scores:  Scores of 19 and below usually indicate a poorer quality of life in these areas.  A difference of  2-3 points is a clinically meaningful difference.  A difference of 2-3 points in the total score of the Quality of Life Index has been associated with significant improvement in overall quality of life, self-image, physical symptoms, and general health in studies assessing change in quality of life.  PHQ-9: Recent Review Flowsheet Data    Depression screen [facility] 2/9 12/10/2020   Decreased Interest 0   Down, Depressed, Hopeless 0   PHQ - 2 Score 0   Altered sleeping 3    Tired, decreased energy 1   Change in appetite 0   Feeling bad or failure about yourself  0   Trouble concentrating 2   Moving slowly or fidgety/restless 0   Suicidal thoughts 0   PHQ-9 Score 6   Difficult doing work/chores Not difficult at all     Interpretation of Total Score  Total Score Depression Severity:  1-4 = Minimal depression, 5-9 = Mild depression, 10-14 = Moderate depression, 15-19 = Moderately severe depression, 20-27 = Severe depression   Psychosocial Evaluation and Intervention:  Psychosocial Evaluation - 12/03/20 1418       Psychosocial Evaluation & Interventions   Interventions Encouraged to exercise with the program and follow exercise prescription;Relaxation education;Stress management education    Comments She misses her house in [place] and is now staying with her daughter. She can look to her daughter and son in-law for support. Her husband passed away in 2010/02/02. She has a positvie outlook on her health.    Expected Outcomes Short: Exercise regularly to support mental health and notify staff of any changes. Long: maintain mental health and well being through teaching of rehab or prescribed medications independently.    Continue Psychosocial Services  Follow up required by staff           Psychosocial Re-Evaluation:  Psychosocial Re-Evaluation    Row Name 01/10/21 1403 02/06/21 1413           Psychosocial Re-Evaluation   Comments Pattie reports sleeping well  and feeling rested.  She says she has no stress concerns Torina syas her Dr ordered a machine for her to sleep ( helps with moisture?)      Expected Outcomes Short: notify staff of any changes Long: maintain positive outlook Short: try machine to help with sleep             Psychosocial Discharge (Final Psychosocial Re-Evaluation):  Psychosocial Re-Evaluation - 02/06/21 1413      Psychosocial Re-Evaluation   Comments Okey Regal syas her Dr ordered a machine for her to sleep ( helps with moisture?)    Expected Outcomes Short: try machine to help with sleep           Education: Education Goals: Education classes will be provided on a weekly basis, covering required topics. Participant will state understanding/return demonstration of topics presented.  Learning Barriers/Preferences:  Learning Barriers/Preferences - 12/03/20 1411      Learning Barriers/Preferences   Learning Barriers None    Learning Preferences None           General Pulmonary Education Topics:  Infection Prevention: - Provides verbal and written  material to individual  with discussion of infection control including proper hand washing and proper equipment cleaning during exercise session. Flowsheet Row Pulmonary Rehab from 12/12/2020 in [facility]  Date 12/10/20  Educator AS  Instruction Review Code 1- Verbalizes Understanding      Falls Prevention: - Provides verbal and written material to individual with discussion of falls prevention and safety. Flowsheet Row Pulmonary Rehab from 12/12/2020 in [facility]  Date 12/10/20  Educator AS  Instruction Review Code 1- Verbalizes Understanding      Chronic Lung Disease Review: - Group verbal instruction with posters, models, PowerPoint presentations and videos,  to review new updates, new respiratory medications, new advancements in procedures and treatments. Providing information on websites and "800" numbers for continued self-education. Includes information about supplement oxygen, available portable oxygen systems, continuous and intermittent flow rates, oxygen safety, concentrators, and Medicare reimbursement for oxygen. Explanation of Pulmonary Drugs, including class, frequency, complications, importance of spacers, rinsing mouth after steroid MDI's, and proper cleaning methods for nebulizers. Review of basic lung anatomy and physiology related to function, structure, and complications of lung disease. Review of risk factors. Discussion about methods for diagnosing sleep apnea and types of masks and machines for OSA. Includes a review of the use of types of environmental controls: home humidity, furnaces, filters, dust mite/pet prevention, HEPA vacuums. Discussion about weather changes, air quality and the benefits of nasal washing. Instruction on Warning signs, infection symptoms, calling MD promptly, preventive modes, and value of vaccinations. Review of effective airway clearance, coughing and/or vibration techniques. Emphasizing that all should Create an Action Plan.  Written material given at graduation. Flowsheet Row Pulmonary Rehab from 12/12/2020 in [facility]  Date 12/12/20  Educator [facility]  Instruction Review Code 1- Verbalizes Understanding      AED/CPR: - Group verbal and written instruction with the use of models to demonstrate the basic use of the AED with the basic ABC's of resuscitation.    Anatomy and Cardiac Procedures: - Group verbal and visual presentation and models provide information about basic cardiac anatomy and function. Reviews the testing methods done to diagnose heart disease and the outcomes of the test results. Describes the treatment choices: Medical Management, Angioplasty, or Coronary Bypass Surgery for treating various heart conditions including Myocardial Infarction, Angina, Valve Disease, and Cardiac Arrhythmias.  Written material given at graduation.   Medication Safety: - Group verbal and visual instruction to review commonly prescribed medications for heart and lung disease. Reviews the medication, class of the drug, and side effects. Includes the steps to properly store meds and maintain the prescription regimen.  Written material given at graduation.   Other: -Provides group and verbal instruction on various topics (see comments)   Knowledge Questionnaire Score:  Knowledge Questionnaire Score - 12/10/20 1559      Knowledge Questionnaire Score   Pre Score 9/18            Core Components/Risk Factors/Patient Goals at Admission:  Personal Goals and Risk Factors at Admission - 12/10/20 1602      Core Components/Risk Factors/Patient Goals on Admission    Weight Management Yes;Weight Maintenance    Intervention Weight Management: Develop a combined nutrition and exercise program designed to reach desired caloric intake, while maintaining appropriate intake of nutrient and fiber, sodium and fats, and appropriate energy expenditure required for the weight goal.;Weight Management: Provide  education and appropriate resources to help participant work on and attain dietary goals.;Weight Management/Obesity: Establish  reasonable short term and long term weight goals.    Expected Outcomes Short Term: Continue to assess and modify interventions until short term weight is achieved;Long Term: Adherence to nutrition and physical activity/exercise program aimed toward attainment of established weight goal;Weight Maintenance: Understanding of the daily nutrition guidelines, which includes 25-35% calories from fat, 7% or less cal from saturated fats, less than 200mg  cholesterol, less than 1.5gm of sodium, & 5 or more servings of fruits and vegetables daily;Understanding recommendations for meals to include 15-35% energy as protein, 25-35% energy from fat, 35-60% energy from carbohydrates, less than 200mg  of dietary cholesterol, 20-35 gm of total fiber daily;Understanding of distribution of calorie intake throughout the day with the consumption of 4-5 meals/snacks    Improve shortness of breath with ADL's Yes    Intervention Provide education, individualized exercise plan and daily activity instruction to help decrease symptoms of SOB with activities of daily living.    Expected Outcomes Short Term: Improve cardiorespiratory fitness to achieve a reduction of symptoms when performing ADLs;Long Term: Be able to perform more ADLs without symptoms or delay the onset of symptoms           Education:Diabetes - Individual verbal and written instruction to review signs/symptoms of diabetes, desired ranges of glucose level fasting, after meals and with exercise. Acknowledge that pre and post exercise glucose checks will be done for 3 sessions at entry of program.   Know Your Numbers and Heart Failure: - Group verbal and visual instruction to discuss disease risk factors for cardiac and pulmonary disease and treatment options.  Reviews associated critical values for Overweight/Obesity, Hypertension,  Cholesterol, and Diabetes.  Discusses basics of heart failure: signs/symptoms and treatments.  Introduces Heart Failure Zone chart for action plan for heart failure.  Written material given at graduation.   Core Components/Risk Factors/Patient Goals Review:   Goals and Risk Factor Review    Row Name 01/10/21 1352 02/06/21 1409           Core Components/Risk Factors/Patient Goals Review   Personal Goals Review Improve shortness of breath with ADL's;Increase knowledge of respiratory medications and ability to use respiratory devices properly.;Develop more efficient breathing techniques such as purse lipped breathing and diaphragmatic breathing and practicing self-pacing with activity. Improve shortness of breath with ADL's;Other      Review Shelene has noticed she can do more at home since she has been exercising at [facility]  She does have a scooter that helps her going out.  She uses a walker at home.  She does walk at home on days not at [facility].   She takes all medication as directed and checking BP at home. Dejuana has been attending consistently.  She got a good report from cardiologist.  She has noticed she can do more at home - she can shower by herself now.    She can run the sweeper also.      Expected Outcomes Short:  continue to take meds as directed and practice PLB  Long:  be abel to do more ADLs with less shortness of breath Short:  continue to attend consistently Long:  continue to build stamina             Core Components/Risk Factors/Patient Goals at Discharge (Final Review):   Goals and Risk Factor Review - 02/06/21 1409      Core Components/Risk Factors/Patient Goals Review   Personal Goals Review Improve shortness of breath with ADL's;Other    Review Mayela has been attending consistently.  She got a good report from cardiologist.  She has noticed she can do more at home - she can shower by herself now.    She can run the sweeper also.    Expected Outcomes Short:  continue to attend  consistently Long:  continue to build stamina           ITP Comments:  ITP Comments    Row Name 12/03/20 1410 12/03/20 1419 12/10/20 1604 12/12/20 1346 01/02/21 0803   ITP Comments Virtual Visit completed. Patient informed on EP and RD appointment and 6 Minute walk test. Patient also informed of patient health questionnaires on My Chart. Patient Verbalizes understanding. Visit diagnosis can be found in [facility] 11/07/2020. Patient will bring her medication list when she comes with her daughter. Completed and gym orientation. Initial ITP created and sent for review to Dr. Bethann Punches, Medical Director. First full day of exercise!  Patient was oriented to gym and equipment including functions, settings, policies, and procedures.  Patient's individual exercise prescription and treatment plan were reviewed.  All starting workloads were established based on the results of the 6 minute walk test done at initial orientation visit.  The plan for exercise progression was also introduced and progression will be customized based on patient's performance and goals. 30 Day review completed. Medical Director ITP review done, changes made as directed, and signed approval by Medical Director.   Row Name 01/02/21 1627 01/30/21 1017 02/20/21 1119       ITP Comments Completed initial RD consultation 30 Day review completed. Medical Director ITP review done, changes made as directed, and signed approval by Medical Director. Patient's daughter called, wants to discharge from the program at this time due to a new medical diagnosis and patient "not feeling up to it." Will discharge at this time.            Comments: Discharge ITP

## 2021-02-20 NOTE — Progress Notes (Signed)
Discharge Progress Report  Patient Details  Name: Danielle Sandoval MRN: [id number] Date of Birth: 1940-12-31 Referring Provider:   Flowsheet Row Pulmonary Rehab from 12/10/2020 in [facility]  Referring Provider Aleskerov       Number of Visits: 28  Reason for Discharge:  Early Exit:  Medical  Smoking History:  Social History   Tobacco Use  Smoking Status Former Smoker  . Packs/day: 1.00  . Years: 45.00  . Pack years: 45.00  . Types: Cigarettes  . Quit date: 12/04/2007  . Years since quitting: 13.2  Smokeless Tobacco Never Used    Diagnosis:  Pulmonary emphysema, unspecified emphysema type (HCC)  ADL [facility]:  Pulmonary Assessment Scores    Row Name 12/10/20 1558         ADL [facility]   SOB Score total 19     Rest 1     Walk 1     Stairs 5     Bath 1     Dress 2     Shop 2           CAT Score   CAT Score 10           mMRC Score   mMRC Score 2            Initial Exercise Prescription:  Initial Exercise Prescription - 12/10/20 1500      Date of Initial Exercise RX and Referring Provider   Date 12/10/20    Referring Provider Aleskerov      Oxygen   Oxygen Continuous    Liters 2-4      Treadmill   MPH 0.5    Grade 0    Minutes 15    METs 0.94      Recumbant Bike   Level 1    RPM 60    Minutes 15    METs 1      NuStep   Level 1    SPM 80    Minutes 15    METs 1      REL-XR   Level 1    Speed 50    Minutes 15    METs 1      Biostep-RELP   Level 1    SPM 50    Minutes 15    METs 1      Prescription Details   Frequency (times per week) 3    Duration Progress to 30 minutes of continuous aerobic without signs/symptoms of physical distress      Intensity   THRR 40-80% of Max Heartrate 99-127    Ratings of Perceived Exertion 11-15    Perceived Dyspnea 0-4      Resistance Training   Training Prescription Yes    Weight 2 lb    Reps 10-15           Discharge Exercise Prescription (Final Exercise Prescription  Changes):  Exercise Prescription Changes - 02/06/21 1600      Response to Exercise   Blood Pressure (Admit) 132/82    Blood Pressure (Exercise) 152/82    Blood Pressure (Exit) 108/68    Heart Rate (Admit) 79 bpm    Heart Rate (Exercise) 79 bpm    Heart Rate (Exit) 72 bpm    Oxygen Saturation (Admit) 94 %    Oxygen Saturation (Exercise) 94 %    Oxygen Saturation (Exit) 94 %    Rating of Perceived Exertion (Exercise) 11    Perceived Dyspnea (Exercise) 2  Duration Continue with 30 min of aerobic exercise without signs/symptoms of physical distress.    Intensity THRR unchanged      Progression   Progression Continue to progress workloads to maintain intensity without signs/symptoms of physical distress.    Average METs 2.3      Resistance Training   Training Prescription Yes    Weight 2 lb    Reps 10-15      Interval Training   Interval Training No      Oxygen   Oxygen Continuous    Liters 4      REL-XR   Level 1    Minutes 15    METs 2.3           Functional Capacity:  6 Minute Walk    Row Name 12/10/20 1552         6 Minute Walk   Phase Initial     Distance 400 feet     Walk Time 5.25 minutes     # of Rest Breaks 1     MPH 0.86     METS 0.94     RPE 11     Perceived Dyspnea  2     VO2 Peak 3.3               Nutrition:  Nutrition Therapy & Goals - 01/02/21 1604      Nutrition Therapy   Diet Heart healthy, low Na, pulmonary MNT    Protein (specify units) 60g    Fiber 25 grams    Whole Grain Foods 3 servings    Saturated Fats 12 max. grams    Fruits and Vegetables 8 servings/day    Sodium 1.5 grams      Personal Nutrition Goals   Nutrition Goal ST:/LT: continue with current eating habits - will continue to follow up    Comments Selena Batten ([place]ara's daughter) reports that Carolynn is eating very well. She doesn't add salt or fried food. Selena Batten also reports that when she cooks, she will use liquid oils like canola oil (olive oil gives Marche GI distress)  She reports her eating cheerios in am, 1/2 sandwich and peanut butter bar for lunch, and dinner with protein and vegetables. She reports her appetite is doing very well. She will also have ice cream on the way home. She was 93lbs, she is now 112 lbs. She will also have cheese snacks and some fruit - will not have cheese snacks if her feet are swollen. Discussed general heart healthy eating and pulmonary MNT. Will check in with both Okey Regal and Selena Batten.      Intervention Plan   Intervention Prescribe, educate and counsel regarding individualized specific dietary modifications aiming towards targeted core components such as weight, hypertension, lipid management, diabetes, heart failure and other comorbidities.;Nutrition handout(s) given to patient.    Expected Outcomes Short Term Goal: Understand basic principles of dietary content, such as calories, fat, sodium, cholesterol and nutrients.;Short Term Goal: A plan has been developed with personal nutrition goals set during dietitian appointment.;Long Term Goal: Adherence to prescribed nutrition plan.            Goals reviewed with patient; copy given to patient.

## 2021-02-20 NOTE — Telephone Encounter (Signed)
Daughter called- wants to discharge patient from pulmonary rehab program due to a new medical diagnosis. We wished her luck and encouraged to reach out if they need anything.

## 2021-05-29 NOTE — Progress Notes (Signed)
MRN : [id number]  Danielle Sandoval is a 80 y.o. (June 03, 1941) female who presents with chief complaint of check AAA.  History of Present Illness:   The patient returns to the office for surveillance of an abdominal aortic aneurysm status post stent graft placement on 08/2015.    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 The patient also has peripheral vascular disease status post abdominal aortic stent 08/2015 and right leg stents 06/2016   Patient denies abdominal pain or back pain, no other abdominal complaints. No groin related complaints. No symptoms consistent with distal embolization No changes in claudication distance.    There have been some changes in her overall healthcare since his last visit. She has had pneumonia 3 times and is now on 4 liters O2 (up from 2l)   Patient denies amaurosis fugax or TIA symptoms. There is no history of claudication or rest pain symptoms of the lower extremities. The patient denies angina or shortness of breath.    Duplex US of the aorta and iliac arteries shows a 2.82 cm AAA sac with no endoleak.  (Duplex US of the aorta and iliac arteries shows a 4.05 cm AAA sac with no endoleak).   Previous duplex ultrasound of the lower extremities bilaterally shows patent arteries with triphasic signals no hemodynamicly significant stenosis is identified.     Previous duplex ultrasound the carotid arteries is consistent with less than 40% stenosis bilateral internal carotid arteries  No outpatient medications have been marked as taking for the 05/30/21 encounter (Appointment) with Gilda Crease, Latina Craver, MD.    Past Medical History:  Diagnosis Date   Arthritis     COPD (chronic obstructive pulmonary disease) (HCC)    Emphysema of lung (HCC)    Heart disease    Hypertension    Thyroid disease     Past Surgical History:  Procedure Laterality Date   ABDOMINAL AORTIC ANEURYSM REPAIR     BREAST BIOPSY Right ?   benign,with marker   CHOLECYSTECTOMY     LEG SURGERY     stent placement   PACEMAKER IMPLANT  09/16/2019    Social History Social History   Tobacco Use   Smoking status: Former    Packs/day: 1.00    Years: 45.00    Pack years: 45.00    Types: Cigarettes    Quit date: 12/04/2007    Years since quitting: 13.4   Smokeless tobacco: Never  Substance Use Topics   Alcohol use: Never   Drug use: Never    Family History Family History  Problem Relation Age of Onset   Dementia Mother    Prostate cancer Father    Varicose Veins Daughter    Diabetes Daughter     Allergies  Allergen Reactions   Ace Inhibitors    Acetone    Aleve [Naproxen]    Amiodarone    Atorvastatin Itching   Azithromycin Swelling    Swelling of the throat    Clotrimazole Hives   Digoxin    Fluvastatin Other (See Comments)   Lisinopril     Other reaction(s): Unknown   Lovastatin Other (See Comments)    Leg aches    Potassium Bromide     Other reaction(s): Unknown   Risedronate  Sodium Itching   Spiriva Respimat [Tiotropium Bromide Monohydrate]    Tiotropium     Other reaction(s): Dizziness     REVIEW OF SYSTEMS (Negative unless checked)  Constitutional: [] Weight loss  [] Fever  [] Chills Cardiac: [] Chest pain   [] Chest pressure   [] Palpitations   [] Shortness of breath when laying flat   [] Shortness of breath with exertion. Vascular:  [] Pain in legs with walking   [] Pain in legs at rest  [] History of DVT   [] Phlebitis   [] Swelling in legs   [] Varicose veins   [] Non-healing ulcers Pulmonary:   [] Uses home oxygen   [] Productive cough   [] Hemoptysis   [] Wheeze  [x] COPD   [] Asthma Neurologic:  [] Dizziness   [] Seizures   [] History of stroke   [] History  of TIA  [] Aphasia   [] Vissual changes   [] Weakness or numbness in arm   [] Weakness or numbness in leg Musculoskeletal:   [] Joint swelling   [] Joint pain   [] Low back pain Hematologic:  [] Easy bruising  [] Easy bleeding   [] Hypercoagulable state   [] Anemic Gastrointestinal:  [] Diarrhea   [] Vomiting  [x] Gastroesophageal reflux/heartburn   [] Difficulty swallowing. Genitourinary:  [] Chronic kidney disease   [] Difficult urination  [] Frequent urination   [] Blood in urine Skin:  [] Rashes   [] Ulcers  Psychological:  [] History of anxiety   []  History of major depression.  Physical Examination  There were no vitals filed for this visit. There is no height or weight on file to calculate BMI. Gen: WD/WN, NAD Head: [place]/AT, No temporalis wasting.  Ear/Nose/Throat: Hearing grossly intact, nares w/o erythema or drainage Eyes: PER, EOMI, sclera nonicteric.  Neck: Supple, no masses.  No bruit or JVD.  Pulmonary:  Good air movement, no audible wheezing, no use of accessory muscles.  Cardiac: RRR, normal S1, S2, no Murmurs. Vascular:   No carotid bruits Vessel Right Left  Radial Palpable Palpable  Carotid Palpable Palpable  Gastrointestinal: soft, non-distended. No guarding/no peritoneal signs.  Musculoskeletal: M/S 5/5 throughout.  No visible deformity.  Neurologic: CN 2-12 intact. Pain and light touch intact in extremities.  Symmetrical.  Speech is fluent. Motor exam as listed above. Psychiatric: Judgment intact, Mood & affect appropriate for pt's clinical situation. Dermatologic: No rashes or ulcers noted.  No changes consistent with cellulitis.   CBC Lab Results  Component Value Date   WBC 6.5 04/18/2020   HGB 10.2 (L) 04/18/2020   HCT 31.8 (L) 04/18/2020   MCV 97.2 04/18/2020   PLT 210 04/18/2020    BMET    Component Value Date/Time   CREATININE 0.90 07/17/2020 1352   CrCl cannot be calculated (Patient's most recent lab result is older than the maximum 21 days allowed.).  COAG Lab  Results  Component Value Date   INR 1.3 (H) 04/18/2020    Radiology No results found.   Assessment/Plan 1. AAA (abdominal aortic aneurysm) without rupture (HCC) Recommend: Patient is status post successful endovascular repair of the AAA.   No further intervention is required at this time.   No endoleak is detected and the aneurysm sac is stable.  The patient will continue antiplatelet therapy as prescribed as well as aggressive management of hyperlipidemia. Exercise is again strongly encouraged.   However, endografts require continued surveillance with ultrasound or CT scan. This is mandatory to detect any changes that allow repressurization of the aneurysm sac.  The patient is informed that this would be asymptomatic.  The patient is reminded that lifelong routine surveillance is a necessity with an endograft. Patient  will continue to follow-up at 6 month intervals with ultrasound of the aorta.  - VAS US AORTA/IVC/ILIACS; Future  2. Bilateral carotid artery stenosis Recommend:  Given the patient's asymptomatic subcritical stenosis no further invasive testing or surgery at this time.  Duplex ultrasound shows <40% stenosis bilaterally.  Continue antiplatelet therapy as prescribed Continue management of CAD, HTN and Hyperlipidemia Healthy heart diet,  encouraged exercise at least 4 times per week Follow up in 12 months with duplex ultrasound and physical exam    3. Peripheral vascular disease (HCC) Recommend:  I do not find evidence of life style limiting vascular disease. The patient specifically denies life style limitation.  Previous noninvasive studies including ABI's of the legs do not identify critical vascular problems.  The patient should continue walking and begin a more formal exercise program. The patient should continue his antiplatelet therapy and aggressive treatment of the lipid abnormalities.  The patient should begin wearing graduated compression socks  15-20 mmHg strength to control her mild edema.   4. Chronic obstructive pulmonary disease, unspecified COPD type (HCC) Continue pulmonary medications and aerosols as already ordered, these medications have been reviewed and there are no changes at this time.      Levora Dredge, MD  05/29/2021 11:35 AM

## 2021-05-30 ENCOUNTER — Ambulatory Visit (INDEPENDENT_AMBULATORY_CARE_PROVIDER_SITE_OTHER): Payer: Medicare Other | Admitting: Vascular Surgery

## 2021-05-30 ENCOUNTER — Encounter (INDEPENDENT_AMBULATORY_CARE_PROVIDER_SITE_OTHER): Payer: Self-pay | Admitting: Vascular Surgery

## 2021-05-30 ENCOUNTER — Other Ambulatory Visit: Payer: Self-pay

## 2021-05-30 ENCOUNTER — Ambulatory Visit (INDEPENDENT_AMBULATORY_CARE_PROVIDER_SITE_OTHER): Payer: Medicare Other

## 2021-05-30 VITALS — BP 146/83 | HR 74 | Resp 16

## 2021-05-30 DIAGNOSIS — I714 Abdominal aortic aneurysm, without rupture, unspecified: Secondary | ICD-10-CM

## 2021-05-30 DIAGNOSIS — J449 Chronic obstructive pulmonary disease, unspecified: Secondary | ICD-10-CM | POA: Diagnosis not present

## 2021-05-30 DIAGNOSIS — I739 Peripheral vascular disease, unspecified: Secondary | ICD-10-CM | POA: Diagnosis not present

## 2021-05-30 DIAGNOSIS — I6523 Occlusion and stenosis of bilateral carotid arteries: Secondary | ICD-10-CM

## 2021-10-16 DEATH — deceased

## 2022-06-02 ENCOUNTER — Encounter (INDEPENDENT_AMBULATORY_CARE_PROVIDER_SITE_OTHER): Payer: PRIVATE HEALTH INSURANCE

## 2022-06-02 ENCOUNTER — Ambulatory Visit (INDEPENDENT_AMBULATORY_CARE_PROVIDER_SITE_OTHER): Payer: PRIVATE HEALTH INSURANCE | Admitting: Nurse Practitioner

## 2022-06-02 IMAGING — CT CT ANGIO CHEST
2 of 7 series · 13 of 36 positions shown · IV contrast (omnipaque)
Comparison: Report of chest radiographs 07/05/2020 (no images
available).

CLINICAL DATA: 79-year-old female with left side chest pain,
pleuritic pain x3 weeks, shortness of breath. Treated for pneumonia.

EXAM:
CT ANGIOGRAPHY CHEST WITH CONTRAST
TECHNIQUE: Multidetector CT imaging of the chest was performed using the
standard protocol during bolus administration of intravenous
contrast. Multiplanar CT image reconstructions and MIPs were
obtained to evaluate the vascular anatomy.
CONTRAST:  60mL OMNIPAQUE IOHEXOL 350 MG/ML SOLN

[Series 10: thins cta pulmonary 1.00 · axial · 0.51mm/px · z∈[-1180,-906]mm · 12 of 324 slices shown]
[im 25/324  lung]
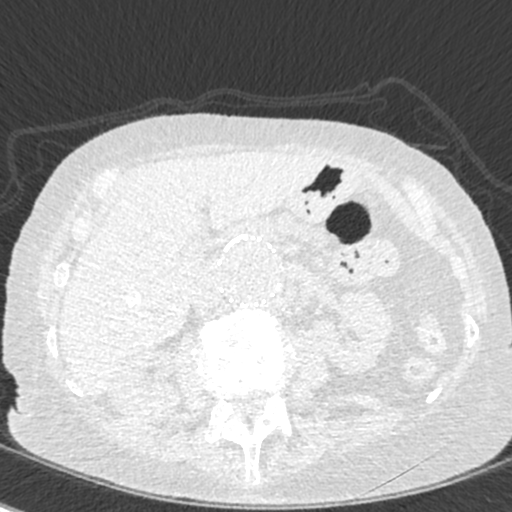
[im 50/324  mediastinal]
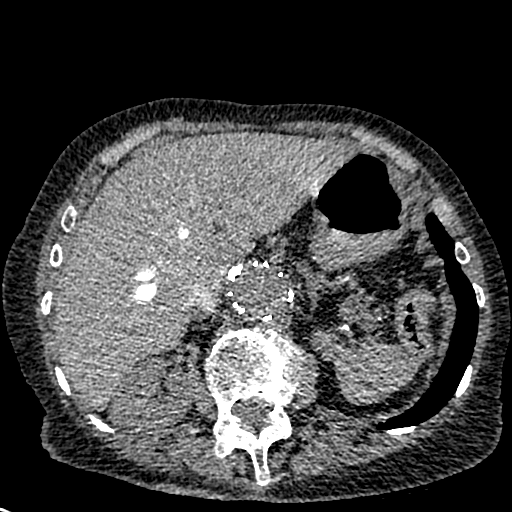
[im 75/324  lung]
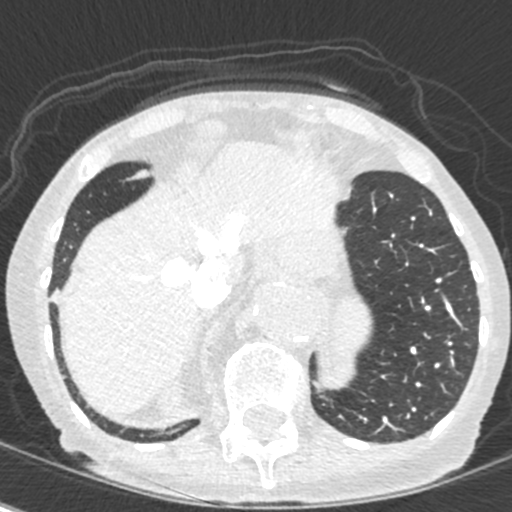
[im 100/324  mediastinal]
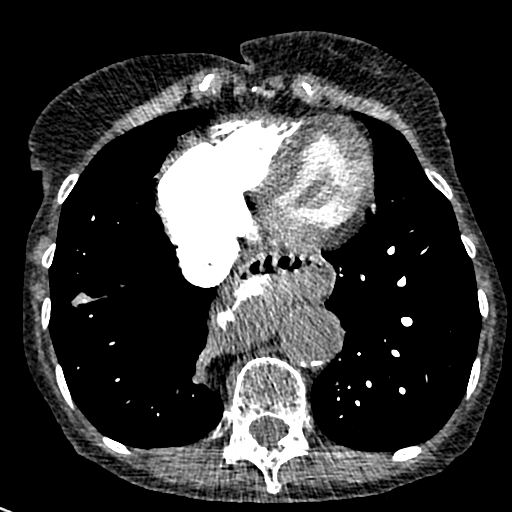
[im 125/324  lung]
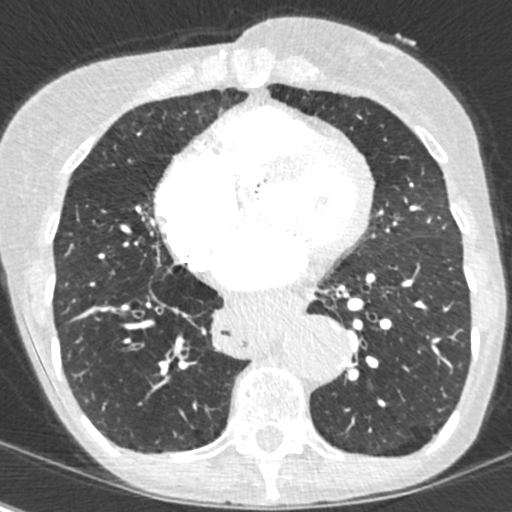
[im 150/324  mediastinal]
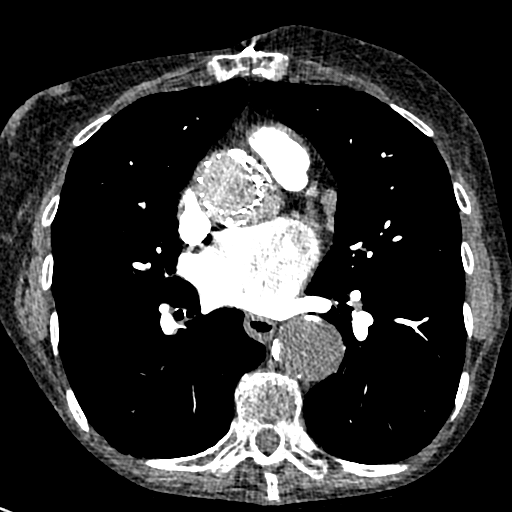
[im 174/324  lung]
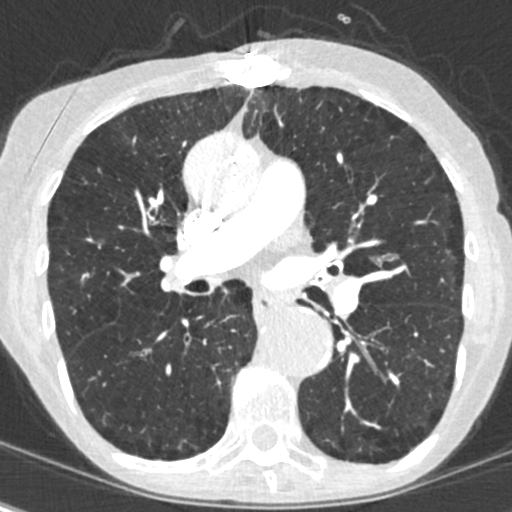
[im 199/324  mediastinal]
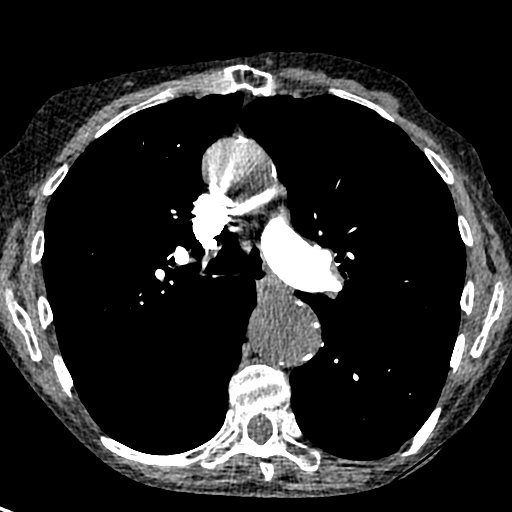
[im 224/324  lung]
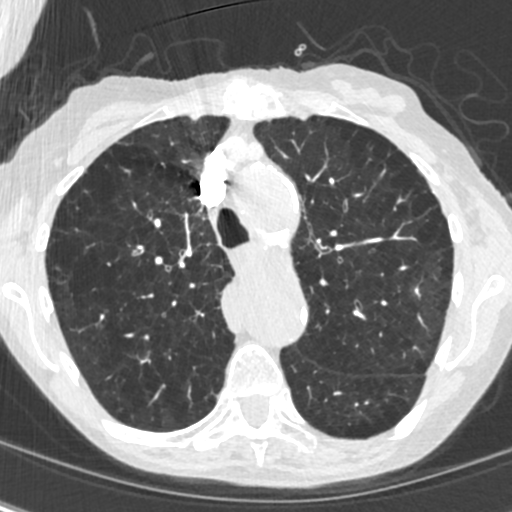
[im 249/324  mediastinal]
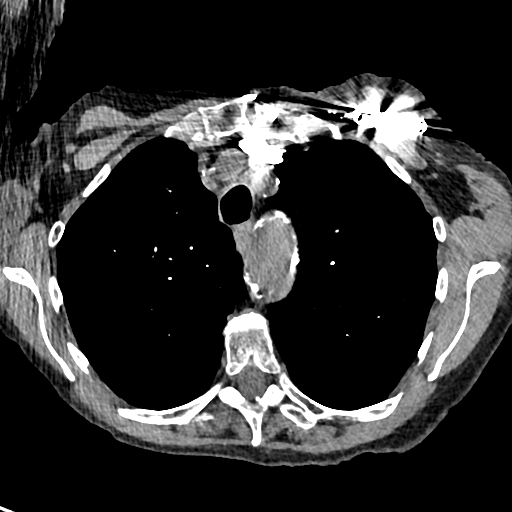
[im 274/324  lung]
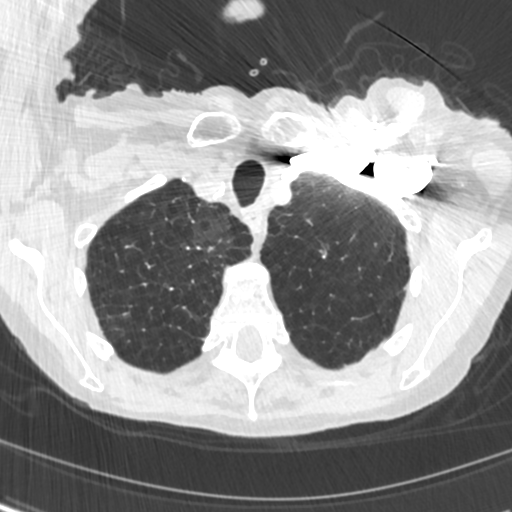
[im 299/324  mediastinal]
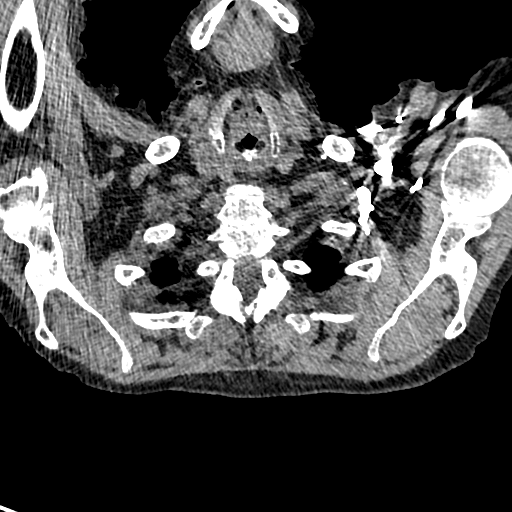

[Series 12: cta pulmonary 2.00 cor · coronal · 0.51mm/px · 1 of 126 slices shown]
[im 63/126  mediastinal]
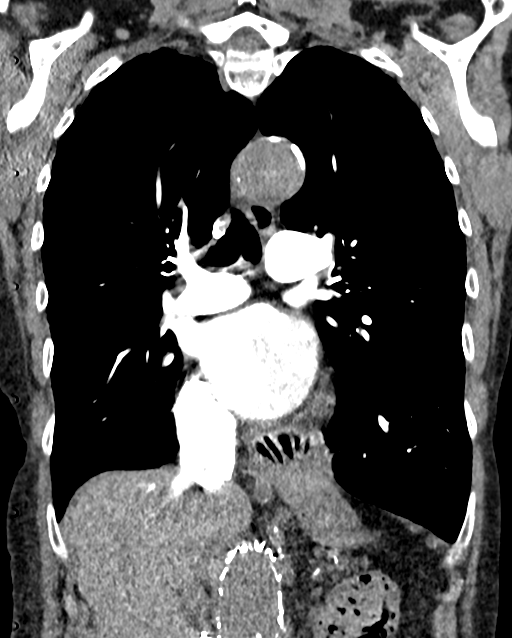

[13 of 36 positions shown; findings below may reference images not displayed]

FINDINGS: Cardiovascular: Excellent contrast bolus timing in the pulmonary
arterial tree.

No focal filling defect identified in the pulmonary arteries to
suggest acute pulmonary embolism.

Left chest cardiac pacemaker. Sequelae of TAVR. Calcified aortic
atherosclerosis. Mild fusiform enlargement of the ascending and
descending thoracic aorta (up to 3.8 cm diameter). Little contrast
in the aorta. Calcified coronary artery atherosclerosis is evident.

Mediastinum/Nodes: No mediastinal lymphadenopathy. Moderate to large
gastric hiatal hernia.

Lungs/Pleura: Paraseptal and centrilobular emphysema. Major airways
are patent. There is mild asymmetric right lower lobe bronchial wall
thickening. Mild right lower lobe basal segment atelectasis or
distal airway opacification. No consolidation. No pleural effusion.
No suspicious pulmonary nodule.

Upper Abdomen: Mild contrast reflux into the hepatic IVC and hepatic
veins. Partially visible abdominal aortic endograft. No contrast in
the abdominal aorta. Negative visible spleen, pancreas, adrenal
glands, and bowel in the upper abdomen. Bilateral small
nephrolithiasis versus renal vascular calcifications.

Musculoskeletal: Osteopenia. Prior sternotomy. Exaggerated thoracic
kyphosis. Mild chronic appearing T11 inferior endplate compression
fracture. No left rib fracture identified. No acute osseous
abnormality identified.

Review of the MIP images confirms the above findings.
IMPRESSION: 1. Negative for acute pulmonary embolus.
2. Emphysema ([id number]). Possible mild right lower lobe distal
airway infection, but no consolidation or pleural effusion.
3. Sequelae of TAVR with mild fusiform enlargement of the ascending
and descending thoracic aorta (up to 3.8 cm diameter). Partially
visible abdominal aortic endograft.
4. Moderate gastric hiatal hernia. Bilateral nephrolithiasis versus
renal vascular calcifications.
5. Mild chronic appearing T11 inferior endplate compression
fracture.
